# Patient Record
Sex: Male | Born: 1940 | Race: White | Hispanic: No | Smoking: Former smoker
Health system: Southern US, Community
[De-identification: ages and names within clinical notes are randomized; demographics above are authoritative.]

## PROBLEM LIST (undated history)

## (undated) DIAGNOSIS — J189 Pneumonia, unspecified organism: Secondary | ICD-10-CM

## (undated) DIAGNOSIS — I10 Essential (primary) hypertension: Secondary | ICD-10-CM

## (undated) DIAGNOSIS — M62838 Other muscle spasm: Secondary | ICD-10-CM

## (undated) DIAGNOSIS — I2510 Atherosclerotic heart disease of native coronary artery without angina pectoris: Secondary | ICD-10-CM

## (undated) DIAGNOSIS — I639 Cerebral infarction, unspecified: Secondary | ICD-10-CM

## (undated) DIAGNOSIS — E291 Testicular hypofunction: Secondary | ICD-10-CM

## (undated) DIAGNOSIS — D649 Anemia, unspecified: Secondary | ICD-10-CM

## (undated) DIAGNOSIS — E119 Type 2 diabetes mellitus without complications: Secondary | ICD-10-CM

## (undated) DIAGNOSIS — I480 Paroxysmal atrial fibrillation: Secondary | ICD-10-CM

## (undated) DIAGNOSIS — I420 Dilated cardiomyopathy: Secondary | ICD-10-CM

## (undated) DIAGNOSIS — E785 Hyperlipidemia, unspecified: Secondary | ICD-10-CM

## (undated) DIAGNOSIS — G43909 Migraine, unspecified, not intractable, without status migrainosus: Secondary | ICD-10-CM

## (undated) DIAGNOSIS — I255 Ischemic cardiomyopathy: Secondary | ICD-10-CM

## (undated) DIAGNOSIS — M1990 Unspecified osteoarthritis, unspecified site: Secondary | ICD-10-CM

## (undated) DIAGNOSIS — M109 Gout, unspecified: Secondary | ICD-10-CM

## (undated) DIAGNOSIS — N200 Calculus of kidney: Secondary | ICD-10-CM

## (undated) HISTORY — DX: Ischemic cardiomyopathy: I25.5

## (undated) HISTORY — PX: APPENDECTOMY: SHX54

## (undated) HISTORY — DX: Dilated cardiomyopathy: I42.0

## (undated) HISTORY — DX: Anemia, unspecified: D64.9

## (undated) HISTORY — DX: Gout, unspecified: M10.9

## (undated) HISTORY — DX: Other muscle spasm: M62.838

## (undated) HISTORY — PX: TONSILLECTOMY: SUR1361

## (undated) HISTORY — DX: Paroxysmal atrial fibrillation: I48.0

## (undated) HISTORY — DX: Essential (primary) hypertension: I10

## (undated) HISTORY — DX: Cerebral infarction, unspecified: I63.9

## (undated) HISTORY — DX: Testicular hypofunction: E29.1

## (undated) HISTORY — DX: Hyperlipidemia, unspecified: E78.5

## (undated) HISTORY — DX: Atherosclerotic heart disease of native coronary artery without angina pectoris: I25.10

---

## 1959-08-02 DIAGNOSIS — N200 Calculus of kidney: Secondary | ICD-10-CM

## 1959-08-02 HISTORY — DX: Calculus of kidney: N20.0

## 1986-12-01 HISTORY — PX: CARPAL TUNNEL RELEASE: SHX101

## 1989-12-01 HISTORY — PX: SOFT TISSUE MASS EXCISION: SHX2419

## 2007-05-21 ENCOUNTER — Inpatient Hospital Stay (HOSPITAL_COMMUNITY)
Admission: RE | Admit: 2007-05-21 | Discharge: 2007-06-11 | Payer: Self-pay | Admitting: Physical Medicine & Rehabilitation

## 2007-05-21 ENCOUNTER — Ambulatory Visit: Payer: Self-pay | Admitting: Physical Medicine & Rehabilitation

## 2011-04-15 NOTE — Discharge Summary (Signed)
NAMEGRAY, DOERING                ACCOUNT NO.:  [id number]   MEDICAL RECORD NO.:  [id number]          PATIENT TYPE:  IPS   LOCATION:  4029                         FACILITY:  [facility]   PHYSICIAN:  Ranelle Oyster, M.D.DATE OF BIRTH:  [birth year]   DATE OF ADMISSION:  05/21/2007  DATE OF DISCHARGE:  06/11/2007                               DISCHARGE SUMMARY   DISCHARGE DIAGNOSES:  1. 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  2. Hypertension.  3. Non-insulin dependent diabetes mellitus.  4. Hyperlipidemia.  5. Subcutaneous Lovenox for deep vein thrombosis prophylaxis.   HISTORY OF PRESENT ILLNESS:  This is a 70 year old white male admitted  to [facility] May 18, 2007, with right-sided weakness.  Cranial  CT scan negative and MRI showed acute nonhemorrhagic posterior left  lenticular nucleus and posterior left corona radiata infarction.  Carotid Dopplers negative.  Chest x-ray negative.  Placed on aspirin and  Plavix therapy.  Subcutaneous Lovenox for deep vein thrombosis  prophylaxis.  Blood pressure monitored with lisinopril added.  Blood  sugars still with some variables on Actos, glyburide and Glucophage.   PAST MEDICAL HISTORY:  See discharge diagnoses.  Remote smoker.  Occasional alcohol.   ALLERGIES:  PENICILLIN.   SOCIAL HISTORY:  Divorced, lives alone.  Daughter from [place] to assist  as needed.  One level home with four steps to entry.  There is a ramp to  be installed.   MEDICATIONS PRIOR TO ADMISSION:  1. Glipizide 20 mg twice daily.  2. Lisinopril 10 mg daily.  3. Glucophage 1000 mg twice daily.   REHABILITATION HOSPITAL COURSE:  The patient was admitted to inpatient  rehab services with therapies initiated on a 3-hour daily basis  consisting of physical therapy, occupational therapy, speech therapy and  rehabilitation nursing.  The following issues were addressed during the  patient's rehabilitation stay.  Pertaining to Mr. Brackeen's left  basal  ganglia infarction with right-sided weakness, he remained on aspirin and  Plavix therapy.  Functionally, he was supervision to minimal assist for  mobility basic transfers.  He was fitted with an AFO brace.  Modified  independent for upper body bathing and dressing, minimal assist lower  body.  He remained on subcutaneous Lovenox for deep vein thrombosis  prophylaxis throughout his rehab course.  Noted history of non-insulin-  dependent diabetes mellitus.  Latest blood sugars 83, 101 and 90.  His  Glucophage had recently been increased to 1250 mg twice daily.  Blood  pressures monitored on lisinopril with no orthostatic changes.  Zocor  had been added for findings of hyperlipidemia during his workup for  stroke.  Baclofen was added at 10 mg twice daily for some increased  spasticity to the right side.   LABORATORY DATA:  Latest labs showed a hemoglobin A1c of 7.5.  Sodium  137, potassium 4.8, BUN 21, creatinine 0.8.  Hemoglobin 14.4, hematocrit  41.9.   DISCHARGE MEDICATIONS:  At the time of dictation included  1. Aspirin 81 mg daily.  2. Plavix 75 mg daily.  3. Glipizide 20 mg twice daily.  4. Lisinopril 40 mg daily.  5. Zocor 80 mg at bedtime.  6. Actos 45 mg daily.  7. Glucophage 1250 mg twice daily.  8. Baclofen 10 mg twice daily.  9. Tylenol as needed.   ACTIVITY:  As tolerated.   DIET:  Diabetic diet.   SPECIAL INSTRUCTIONS:  The patient would follow up Dr. Faith Rogue at  the outpatient rehab service office as advised.      Mariam Dollar, P.A.      Ranelle Oyster, M.D.  Electronically Signed    DA/MEDQ  D:  06/09/2007  T:  06/09/2007  Job:  [id number]
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
## 2011-09-17 LAB — COMPREHENSIVE METABOLIC PANEL
ALT: 22
AST: 24
Alkaline Phosphatase: 44
CO2: 27
Calcium: 9.6
GFR calc Af Amer: >60
GFR calc non Af Amer: >60
Potassium: 4.8
Sodium: 137

## 2011-09-17 LAB — DIFFERENTIAL
Basophils Relative: 2 — ABNORMAL HIGH
Eosinophils Absolute: 0.1
Eosinophils Relative: 1
Lymphs Abs: 1.8
Monocytes Relative: 7

## 2011-09-17 LAB — CBC
Hemoglobin: 14.4
MCHC: 34.5
RBC: 4.37
WBC: 6.4

## 2011-09-17 LAB — HEMOGLOBIN A1C: Hgb A1c MFr Bld: 7.5 — ABNORMAL HIGH

## 2015-06-27 ENCOUNTER — Encounter: Payer: Self-pay | Admitting: Cardiology

## 2015-06-27 ENCOUNTER — Ambulatory Visit (INDEPENDENT_AMBULATORY_CARE_PROVIDER_SITE_OTHER): Payer: Medicare Other | Admitting: Cardiology

## 2015-06-27 ENCOUNTER — Telehealth (HOSPITAL_COMMUNITY): Payer: Self-pay

## 2015-06-27 VITALS — BP 122/66 | HR 105 | Ht 68.0 in | Wt 196.8 lb

## 2015-06-27 DIAGNOSIS — I481 Persistent atrial fibrillation: Secondary | ICD-10-CM | POA: Diagnosis not present

## 2015-06-27 DIAGNOSIS — Z01812 Encounter for preprocedural laboratory examination: Secondary | ICD-10-CM | POA: Diagnosis not present

## 2015-06-27 DIAGNOSIS — E785 Hyperlipidemia, unspecified: Secondary | ICD-10-CM | POA: Diagnosis not present

## 2015-06-27 DIAGNOSIS — R0689 Other abnormalities of breathing: Secondary | ICD-10-CM | POA: Insufficient documentation

## 2015-06-27 DIAGNOSIS — I420 Dilated cardiomyopathy: Secondary | ICD-10-CM

## 2015-06-27 DIAGNOSIS — I429 Cardiomyopathy, unspecified: Secondary | ICD-10-CM | POA: Insufficient documentation

## 2015-06-27 DIAGNOSIS — I10 Essential (primary) hypertension: Secondary | ICD-10-CM | POA: Insufficient documentation

## 2015-06-27 DIAGNOSIS — E1121 Type 2 diabetes mellitus with diabetic nephropathy: Secondary | ICD-10-CM | POA: Diagnosis not present

## 2015-06-27 DIAGNOSIS — I4819 Other persistent atrial fibrillation: Secondary | ICD-10-CM | POA: Insufficient documentation

## 2015-06-27 DIAGNOSIS — E119 Type 2 diabetes mellitus without complications: Secondary | ICD-10-CM | POA: Insufficient documentation

## 2015-06-27 LAB — BASIC METABOLIC PANEL
BUN: 9 mg/dL (ref 6–23)
CHLORIDE: 101 meq/L (ref 96–112)
CO2: 26 meq/L (ref 19–32)
Calcium: 9.6 mg/dL (ref 8.4–10.5)
Creatinine, Ser: 0.84 mg/dL (ref 0.40–1.50)
GFR: 94.43 mL/min (ref >60.00)
GLUCOSE: 154 mg/dL — AB (ref 70–99)
Potassium: 4.4 mEq/L (ref 3.5–5.1)
Sodium: 135 mEq/L (ref 135–145)

## 2015-06-27 LAB — CBC WITH DIFFERENTIAL/PLATELET
BASOS PCT: 0.3 % (ref 0.0–3.0)
Basophils Absolute: 0 10*3/uL (ref 0.0–0.1)
EOS PCT: 0.6 % (ref 0.0–5.0)
Eosinophils Absolute: 0 10*3/uL (ref 0.0–0.7)
HEMATOCRIT: 44 % (ref 39.0–52.0)
HEMOGLOBIN: 14.7 g/dL (ref 13.0–17.0)
LYMPHS PCT: 22.4 % (ref 12.0–46.0)
Lymphs Abs: 1.9 10*3/uL (ref 0.7–4.0)
MCHC: 33.3 g/dL (ref 30.0–36.0)
MCV: 95.6 fl (ref 78.0–100.0)
MONOS PCT: 4.8 % (ref 3.0–12.0)
Monocytes Absolute: 0.4 10*3/uL (ref 0.1–1.0)
Neutro Abs: 6.1 10*3/uL (ref 1.4–7.7)
Neutrophils Relative %: 71.9 % (ref 43.0–77.0)
Platelets: 279 10*3/uL (ref 150.0–400.0)
RBC: 4.6 Mil/uL (ref 4.22–5.81)
RDW: 13.2 % (ref 11.5–15.5)
WBC: 8.5 10*3/uL (ref 4.0–10.5)

## 2015-06-27 LAB — APTT: aPTT: 38.7 s — ABNORMAL HIGH (ref 23.4–32.7)

## 2015-06-27 LAB — PROTIME-INR
INR: 1.5 ratio — ABNORMAL HIGH (ref 0.8–1.0)
Prothrombin Time: 16.6 s — ABNORMAL HIGH (ref 9.6–13.1)

## 2015-06-27 MED ORDER — CARVEDILOL 3.125 MG PO TABS: 3.1250 mg | ORAL_TABLET | Freq: Two times a day (BID) | ORAL | Status: DC

## 2015-06-27 NOTE — Progress Notes (Signed)
Cardiology Office Note   Date:  06/27/2015   ID:  Scott Levine [birth year], MRN [id number]  PCP:  Scott Rich, MD    Chief Complaint  Patient presents with  . New Evaluation    Atrial Fib      History of Present Illness: Scott Levine is a 74 y.o. male who presents for evaluation of new onset atrial fibrillation.  He has a history of HTN, Type II DM, dyslipidemia and prior CVA.  He was recently see by his PCP for routine PE and was noted to be in new afib by EKG.  2D echo was done showing moderately reduced LVF with EF 35-40% with mid anteroseptal and anteroapical HK, mildly dilated LA.  He was started on Xarelto and now presents for further evaluation.  He says that he has had occasional skipped beats for years but no afib.  He denies any SOB, DOE, LE edema or syncope.  He has chronic dizziness that he describes as problems with balance.  He denies any prior history of chest pain other than a sharp pain earlier this year.  He does not snore but occasionally wakes up gasping for breath due to his nose being stopped.  If he blows his nose it goes away.      Past Medical History  Diagnosis Date  . HTN (hypertension)   . DM (diabetes mellitus)   . Atrial fibrillation   . Stroke   . Anemia   . Hyperlipidemia   . Hypogonadism male   . Gout   . Leg muscle spasm   . Cardiomyopathy     EF 35-40% echo    Past Surgical History  Procedure Laterality Date  . Appendectomy    . Tonsillectomy    . Other surgical history      GROWTH ON CHEST TAKEN OFF     Current Outpatient Prescriptions  Medication Sig Dispense Refill  . aspirin 81 MG tablet Take 81 mg by mouth daily.    Marland Kitchen glimepiride (AMARYL) 4 MG tablet Take 4 mg by mouth daily with breakfast.    . linagliptin (TRADJENTA) 5 MG TABS tablet Take 5 mg by mouth daily.    Marland Kitchen lisinopril (PRINIVIL,ZESTRIL) 20 MG tablet Take 20 mg by mouth daily.    . metFORMIN (GLUCOPHAGE) 1000 MG tablet Take 1,000 mg by mouth.     . rivaroxaban (XARELTO) 20 MG TABS tablet Take 20 mg by mouth daily with supper. To prevent stroke    . simvastatin (ZOCOR) 20 MG tablet Take 20 mg by mouth daily.     No current facility-administered medications for this visit.    Allergies:   Penicillins    Social History:  The patient  reports that he quit smoking about 51 years ago. He does not have any smokeless tobacco history on file. He reports that he does not drink alcohol or use illicit drugs.   Family History:  The patient's family history includes Cancer in his mother and sister; Cancer - Other in his brother; Diabetes in his maternal grandfather, maternal grandmother, mother, and son; Heart attack in his father; Other in his brother, brother, daughter, and son.    ROS:  Please see the history of present illness.   Otherwise, review of systems are positive for none.   All other systems are reviewed and negative.    PHYSICAL EXAM: VS:  BP 100/58 mmHg  Pulse 105  Ht 5\' 8"  (1.727 m)  Wt 196 lb 12.8 oz (89.268 kg)  BMI 29.93 kg/m2 , BMI Body mass index is 29.93 kg/(m^2). GEN: Well nourished, well developed, in no acute distress HEENT: normal Neck: no JVD, carotid bruits, or masses Cardiac: irregularly irregular; no murmurs, rubs, or gallops,no edema  Respiratory:  clear to auscultation bilaterally, normal work of breathing GI: soft, nontender, nondistended, + BS MS: no deformity or atrophy Skin: warm and dry, no rash Neuro:  Strength and sensation are intact Psych: euthymic mood, full affect   EKG:  EKG is ordered today. The ekg ordered today demonstrates atrial fibrillation with RVR at 105 bpm with occasional aberration and nonspecific T wave abnormality   Recent Labs: No results found for requested labs within last 365 days.    Lipid Panel No results found for: CHOL, TRIG, HDL, CHOLHDL, VLDL, LDLCALC, LDLDIRECT    Wt Readings from Last 3 Encounters:  06/27/15 196 lb 12.8 oz (89.268 kg)         ASSESSMENT AND PLAN:  1.  New onset atrial fibrillation of unknown duration now on Xarelto for past 4 weeks.  This patients CHA2DS2-VASc Score and unadjusted Ischemic Stroke Rate (% per year) is equal to 9.8 % stroke rate/year from a score of 6.  Above score calculated as 1 point each if present [CHF, HTN, DM, ascular=MI/PAD/Aortic Plaque, Age if 65-74, or Male].  Above score calculated as 2 points each if present [Age > 75, or Stroke/TIA/TE]  His HR is not adequately controlled.  I will add Coreg 3.125mg  BID for rate control.  He has been on xarelto for 4 weeks and has not missed any doses.  Will set up for DCCV next week.  I will also get a home sleep study to rule out PAF given his afib and problems with SOB and gasping for air at night. 2.  HTN - controlled on ACE I 3.  Type II DM 4.  H/O CVA 5.  DCM ? Tachycardia mediated from afib.  Continue ACE I and add low dose coreg.  He appears euvolemic on exam.     Current medicines are reviewed at length with the patient today.  The patient does not have concerns regarding medicines.  The following changes have been made:  no change  Labs/ tests ordered today: See above Assessment and Plan No orders of the defined types were placed in this encounter.     Disposition:   FU with me in 4 weeks  Signed, Quintella Reichert, MD  06/27/2015 9:45 AM    [facility] [address] [place], [place], [place]  [address] Phone: [phone]; Fax: [phone]

## 2015-06-27 NOTE — Patient Instructions (Addendum)
Medication Instructions:  Your physician has recommended you make the following change in your medication:  1) STOP ASPIRIN 2) START COREG 3.125 mg TWICE DAILY  Labwork: TODAY: BMET. CBC, PT, PTT  Testing/Procedures: Your physician has recommended that you have a Cardioversion (DCCV). Electrical Cardioversion uses a jolt of electricity to your heart either through paddles or wired patches attached to your chest. This is a controlled, usually prescheduled, procedure. Defibrillation is done under light anesthesia in the hospital, and you usually go home the day of the procedure. This is done to get your heart back into a normal rhythm. You are not awake for the procedure. Please see the instruction sheet given to you today.  Your physician has requested that you have a lexiscan myoview. For further information please visit [email or web address] Please follow instruction sheet, as given.  Follow-Up: Your physician recommends that you schedule a follow-up appointment in: 4 weeks with Dr. Mayford Knife.  Any Other Special Instructions Will Be Listed Below (If Applicable).

## 2015-06-27 NOTE — Telephone Encounter (Signed)
Patient given detailed instructions per Myocardial Perfusion Study Information Sheet for test on 07-02-2015 at 0815. Patient Notified to arrive 15 minutes early, and that it is imperative to arrive on time for appointment to keep from having the test rescheduled. Patient verbalized understanding. Randa Evens, Jiyah Torpey A

## 2015-06-28 ENCOUNTER — Telehealth: Payer: Self-pay

## 2015-06-28 NOTE — Telephone Encounter (Signed)
Informed patient of results and verbal understanding expressed.  Patient DCCV scheduled 07/05/15 with Dr. Eden Emms. Instructions reviewed and patient will pick up letter on Monday when he comes for myoview. Patient agrees with treatment plan.

## 2015-06-30 ENCOUNTER — Other Ambulatory Visit: Payer: Self-pay | Admitting: Cardiology

## 2015-06-30 DIAGNOSIS — I4891 Unspecified atrial fibrillation: Secondary | ICD-10-CM

## 2015-07-02 ENCOUNTER — Ambulatory Visit (HOSPITAL_COMMUNITY): Payer: Medicare Other | Attending: Cardiovascular Disease

## 2015-07-02 DIAGNOSIS — Z8249 Family history of ischemic heart disease and other diseases of the circulatory system: Secondary | ICD-10-CM | POA: Insufficient documentation

## 2015-07-02 DIAGNOSIS — I420 Dilated cardiomyopathy: Secondary | ICD-10-CM | POA: Diagnosis present

## 2015-07-02 DIAGNOSIS — E119 Type 2 diabetes mellitus without complications: Secondary | ICD-10-CM | POA: Insufficient documentation

## 2015-07-02 DIAGNOSIS — I10 Essential (primary) hypertension: Secondary | ICD-10-CM | POA: Insufficient documentation

## 2015-07-02 DIAGNOSIS — Z8673 Personal history of transient ischemic attack (TIA), and cerebral infarction without residual deficits: Secondary | ICD-10-CM | POA: Diagnosis not present

## 2015-07-02 DIAGNOSIS — R42 Dizziness and giddiness: Secondary | ICD-10-CM | POA: Diagnosis not present

## 2015-07-02 DIAGNOSIS — E663 Overweight: Secondary | ICD-10-CM | POA: Insufficient documentation

## 2015-07-02 DIAGNOSIS — R079 Chest pain, unspecified: Secondary | ICD-10-CM | POA: Insufficient documentation

## 2015-07-02 DIAGNOSIS — Z87891 Personal history of nicotine dependence: Secondary | ICD-10-CM | POA: Diagnosis not present

## 2015-07-02 MED ORDER — REGADENOSON 0.4 MG/5ML IV SOLN
0.4000 mg | Freq: Once | INTRAVENOUS | Status: AC
  Administered 2015-07-02: 0.4 mg via INTRAVENOUS

## 2015-07-02 MED ORDER — TECHNETIUM TC 99M SESTAMIBI GENERIC - CARDIOLITE
29.7000 | Freq: Once | INTRAVENOUS | Status: AC | PRN
  Administered 2015-07-02: 30 via INTRAVENOUS

## 2015-07-02 MED ORDER — TECHNETIUM TC 99M SESTAMIBI GENERIC - CARDIOLITE
10.8000 | Freq: Once | INTRAVENOUS | Status: AC | PRN
  Administered 2015-07-02: 11 via INTRAVENOUS

## 2015-07-03 LAB — MYOCARDIAL PERFUSION IMAGING
CHL CUP NUCLEAR SRS: 4
CSEPPBP: 132.77 mmHg
CSEPPHR: 106 {beats}/min
RATE: 0.33
Rest HR: 76 {beats}/min
SDS: 3
SSS: 7
TID: 1

## 2015-07-05 ENCOUNTER — Telehealth: Payer: Self-pay

## 2015-07-05 ENCOUNTER — Ambulatory Visit (HOSPITAL_COMMUNITY)
Admission: RE | Admit: 2015-07-05 | Discharge: 2015-07-05 | Disposition: A | Discharge status: Home / Self Care | Payer: Medicare Other | Source: Ambulatory Visit | Attending: Cardiovascular Disease | Admitting: Cardiovascular Disease

## 2015-07-05 ENCOUNTER — Encounter (HOSPITAL_COMMUNITY): Payer: Self-pay

## 2015-07-05 ENCOUNTER — Ambulatory Visit (HOSPITAL_COMMUNITY): Payer: Medicare Other | Admitting: Certified Registered Nurse Anesthetist

## 2015-07-05 ENCOUNTER — Encounter (HOSPITAL_COMMUNITY)
Admission: RE | Disposition: A | Discharge status: Home / Self Care | Payer: Self-pay | Source: Ambulatory Visit | Attending: Cardiovascular Disease

## 2015-07-05 DIAGNOSIS — R0689 Other abnormalities of breathing: Secondary | ICD-10-CM

## 2015-07-05 DIAGNOSIS — Z7982 Long term (current) use of aspirin: Secondary | ICD-10-CM | POA: Insufficient documentation

## 2015-07-05 DIAGNOSIS — I10 Essential (primary) hypertension: Secondary | ICD-10-CM | POA: Insufficient documentation

## 2015-07-05 DIAGNOSIS — E785 Hyperlipidemia, unspecified: Secondary | ICD-10-CM | POA: Insufficient documentation

## 2015-07-05 DIAGNOSIS — I69351 Hemiplegia and hemiparesis following cerebral infarction affecting right dominant side: Secondary | ICD-10-CM | POA: Diagnosis not present

## 2015-07-05 DIAGNOSIS — I429 Cardiomyopathy, unspecified: Secondary | ICD-10-CM | POA: Diagnosis not present

## 2015-07-05 DIAGNOSIS — Z7901 Long term (current) use of anticoagulants: Secondary | ICD-10-CM | POA: Insufficient documentation

## 2015-07-05 DIAGNOSIS — Z87891 Personal history of nicotine dependence: Secondary | ICD-10-CM | POA: Diagnosis not present

## 2015-07-05 DIAGNOSIS — I4891 Unspecified atrial fibrillation: Secondary | ICD-10-CM

## 2015-07-05 DIAGNOSIS — E119 Type 2 diabetes mellitus without complications: Secondary | ICD-10-CM | POA: Diagnosis not present

## 2015-07-05 DIAGNOSIS — Z79899 Other long term (current) drug therapy: Secondary | ICD-10-CM | POA: Diagnosis not present

## 2015-07-05 HISTORY — PX: CARDIOVERSION: SHX1299

## 2015-07-05 LAB — GLUCOSE, CAPILLARY: Glucose-Capillary: 155 mg/dL — ABNORMAL HIGH (ref 65–99)

## 2015-07-05 SURGERY — CARDIOVERSION
Anesthesia: Monitor Anesthesia Care

## 2015-07-05 MED ORDER — LIDOCAINE HCL (CARDIAC) 20 MG/ML IV SOLN
INTRAVENOUS | Status: DC | PRN
  Administered 2015-07-05: 30 mg via INTRAVENOUS

## 2015-07-05 MED ORDER — SODIUM CHLORIDE 0.9 % IJ SOLN: 3.0000 mL | Freq: Two times a day (BID) | INTRAMUSCULAR | Status: DC

## 2015-07-05 MED ORDER — SODIUM CHLORIDE 0.9 % IJ SOLN: 3.0000 mL | INTRAMUSCULAR | Status: DC | PRN

## 2015-07-05 MED ORDER — SODIUM CHLORIDE 0.9 % IV SOLN
250.0000 mL | INTRAVENOUS | Status: DC
  Administered 2015-07-05: 10:00:00 via INTRAVENOUS
  Administered 2015-07-05: 250 mL via INTRAVENOUS

## 2015-07-05 MED ORDER — PROPOFOL 10 MG/ML IV BOLUS
INTRAVENOUS | Status: DC | PRN
  Administered 2015-07-05: 50 mg via INTRAVENOUS

## 2015-07-05 NOTE — Anesthesia Postprocedure Evaluation (Signed)
  Anesthesia Post-op Note  Patient: Scott Levine  Procedure(s) Performed: Procedure(s): CARDIOVERSION (N/A)  Patient Location: PACU  Anesthesia Type:MAC  Level of Consciousness: awake  Airway and Oxygen Therapy: Patient Spontanous Breathing  Post-op Pain: none  Post-op Assessment: Post-op Vital signs reviewed              Post-op Vital Signs: Reviewed and stable  Last Vitals:  Filed Vitals:   07/05/15 1022  BP: 127/81  Pulse: 84  Temp:   Resp: 23    Complications: No apparent anesthesia complications

## 2015-07-05 NOTE — Transfer of Care (Signed)
Immediate Anesthesia Transfer of Care Note  Patient: Scott Levine  Procedure(s) Performed: Procedure(s): CARDIOVERSION (N/A)  Patient Location: Endoscopy Unit  Anesthesia Type:MAC  Level of Consciousness: sedated  Airway & Oxygen Therapy: Patient Spontanous Breathing and Patient connected to nasal cannula oxygen  Post-op Assessment: Report given to RN and Post -op Vital signs reviewed and stable  Post vital signs: Reviewed and stable  Last Vitals:  Filed Vitals:   07/05/15 1022  BP: 127/81  Pulse: 84  Temp:   Resp: 23    Complications: No apparent anesthesia complications

## 2015-07-05 NOTE — Anesthesia Preprocedure Evaluation (Addendum)
Anesthesia Evaluation  Patient identified by MRN, date of birth, ID band  Reviewed: Allergy & Precautions, NPO status , Patient's Chart, lab work & pertinent test results  Airway Mallampati: II   Neck ROM: Full    Dental  (+) Missing, Dental Advisory Given   Pulmonary former smoker,    Pulmonary exam normal       Cardiovascular hypertension, Pt. on medications + dysrhythmias Atrial Fibrillation Rhythm:Irregular Rate:Normal  STRESS 07/02/2015 Fixed inferior defect   Neuro/Psych CVA (right sided weakness- uses walker), Residual Symptoms    GI/Hepatic   Endo/Other  diabetes, Type 2  Renal/GU      Musculoskeletal   Abdominal Normal abdominal exam  (+)   Peds  Hematology   Anesthesia Other Findings   Reproductive/Obstetrics                           Anesthesia Physical Anesthesia Plan  ASA: III  Anesthesia Plan: MAC   Post-op Pain Management:    Induction: Intravenous  Airway Management Planned: Nasal Cannula  Additional Equipment:   Intra-op Plan:   Post-operative Plan:   Informed Consent: I have reviewed the patients History and Physical, chart, labs and discussed the procedure including the risks, benefits and alternatives for the proposed anesthesia with the patient or authorized representative who has indicated his/her understanding and acceptance.     Plan Discussed with:   Anesthesia Plan Comments:         Anesthesia Quick Evaluation

## 2015-07-05 NOTE — CV Procedure (Signed)
DCC:  50 mg propofol and 30 mg lidocain by anesthesia  Afib rate 90 with NSVT and PVC;s prior to [facility] 120 J biphasic shock delivered with conversion to NSR On Rx Xarelto with no missed doses No immediate neurologic sequelae  [name]

## 2015-07-05 NOTE — Interval H&P Note (Signed)
History and Physical Interval Note:  07/05/2015 8:07 AM  Scott Levine  has presented today for surgery, with the diagnosis of AFIB  The various methods of treatment have been discussed with the patient and family. After consideration of risks, benefits and other options for treatment, the patient has consented to  Procedure(s): CARDIOVERSION (N/A) as a surgical intervention .  The patient's history has been reviewed, patient examined, no change in status, stable for surgery.  I have reviewed the patient's chart and labs.  Questions were answered to the patient's satisfaction.     [name]

## 2015-07-05 NOTE — Telephone Encounter (Signed)
Notes Recorded by Quintella Reichert, MD on 07/03/2015 at 2:11 PM Patient had echo at outside facility and I would like this repeated Notes Recorded by Quintella Reichert, MD on 07/03/2015 at 2:09 PM Inferolateral fixed defect c/w scar vs. Attenuation - please get an echo to assess for wall motion and EF   Repeat ECHO ordered for scheduling.

## 2015-07-05 NOTE — Discharge Instructions (Signed)
Electrical Cardioversion, Care After °Refer to this sheet in the next few weeks. These instructions provide you with information on caring for yourself after your procedure. Your health care provider may also give you more specific instructions. Your treatment has been planned according to current medical practices, but problems sometimes occur. Call your health care provider if you have any problems or questions after your procedure. °WHAT TO EXPECT AFTER THE PROCEDURE °After your procedure, it is typical to have the following sensations: °· Some redness on the skin where the shocks were delivered. If this is tender, a sunburn lotion or hydrocortisone cream may help. °· Possible return of an abnormal heart rhythm within hours or days after the procedure. °HOME CARE INSTRUCTIONS °· Take medicines only as directed by your health care provider. Be sure you understand how and when to take your medicine. °· Learn how to feel your pulse and check it often. °· Limit your activity for 48 hours after the procedure or as directed by your health care provider. °· Avoid or minimize caffeine and other stimulants as directed by your health care provider. °SEEK MEDICAL CARE IF: °· You feel like your heart is beating too fast or your pulse is not regular. °· You have any questions about your medicines. °· You have bleeding that will not stop. °SEEK IMMEDIATE MEDICAL CARE IF: °· You are dizzy or feel faint. °· It is hard to breathe or you feel short of breath. °· There is a change in discomfort in your chest. °· Your speech is slurred or you have trouble moving an arm or leg on one side of your body. °· You get a serious muscle cramp that does not go away. °· Your fingers or toes turn cold or blue. °Document Released: 09/07/2013 Document Revised: 04/03/2014 Document Reviewed: 09/07/2013 °[employer]® Patient Information ©2015 [employer]. This information is not intended to replace advice given to you by your health care provider.  Make sure you discuss any questions you have with your health care provider. ° ° °Conscious Sedation, Adult, Care After °Refer to this sheet in the next few weeks. These instructions provide you with information on caring for yourself after your procedure. Your health care provider may also give you more specific instructions. Your treatment has been planned according to current medical practices, but problems sometimes occur. Call your health care provider if you have any problems or questions after your procedure. °WHAT TO EXPECT AFTER THE PROCEDURE  °After your procedure: °· You may feel sleepy, clumsy, and have poor balance for several hours. °· Vomiting may occur if you eat too soon after the procedure. °HOME CARE INSTRUCTIONS °· Do not participate in any activities where you could become injured for at least 24 hours. Do not: °¨ Drive. °¨ Swim. °¨ Ride a bicycle. °¨ Operate heavy machinery. °¨ Cook. °¨ Use power tools. °¨ Climb ladders. °¨ Work from a high place. °· Do not make important decisions or sign legal documents until you are improved. °· If you vomit, drink water, juice, or soup when you can drink without vomiting. Make sure you have little or no nausea before eating solid foods. °· Only take over-the-counter or prescription medicines for pain, discomfort, or fever as directed by your health care provider. °· Make sure you and your family fully understand everything about the medicines given to you, including what side effects may occur. °· You should not drink alcohol, take sleeping pills, or take medicines that cause drowsiness for at least 24   hours. °· If you smoke, do not smoke without supervision. °· If you are feeling better, you may resume normal activities 24 hours after you were sedated. °· Keep all appointments with your health care provider. °SEEK MEDICAL CARE IF: °· Your skin is pale or bluish in color. °· You continue to feel nauseous or vomit. °· Your pain is getting worse and is not  helped by medicine. °· You have bleeding or swelling. °· You are still sleepy or feeling clumsy after 24 hours. °SEEK IMMEDIATE MEDICAL CARE IF: °· You develop a rash. °· You have difficulty breathing. °· You develop any type of allergic problem. °· You have a fever. °MAKE SURE YOU: °· Understand these instructions. °· Will watch your condition. °· Will get help right away if you are not doing well or get worse. °Document Released: 09/07/2013 Document Reviewed: 09/07/2013 °[employer]® Patient Information ©2015 [employer]. This information is not intended to replace advice given to you by your health care provider. Make sure you discuss any questions you have with your health care provider. ° °

## 2015-07-05 NOTE — H&P (View-Only) (Signed)
                Cardiology Office Note   Date:  06/27/2015   ID:  Scott Levine, DOB 11/30/1941, MRN [id number]  PCP:  DOUGH,ROBERT, MD    Chief Complaint  Patient presents with  . New Evaluation    Atrial Fib      History of Present Illness: Scott Levine is a 74 y.o. male who presents for evaluation of new onset atrial fibrillation.  He has a history of HTN, Type II DM, dyslipidemia and prior CVA.  He was recently see by his PCP for routine PE and was noted to be in new afib by EKG.  2D echo was done showing moderately reduced LVF with EF 35-40% with mid anteroseptal and anteroapical HK, mildly dilated LA.  He was started on Xarelto and now presents for further evaluation.  He says that he has had occasional skipped beats for years but no afib.  He denies any SOB, DOE, LE edema or syncope.  He has chronic dizziness that he describes as problems with balance.  He denies any prior history of chest pain other than a sharp pain earlier this year.  He does not snore but occasionally wakes up gasping for breath due to his nose being stopped.  If he blows his nose it goes away.      Past Medical History  Diagnosis Date  . HTN (hypertension)   . DM (diabetes mellitus)   . Atrial fibrillation   . Stroke   . Anemia   . Hyperlipidemia   . Hypogonadism male   . Gout   . Leg muscle spasm   . Cardiomyopathy     EF 35-40% echo    Past Surgical History  Procedure Laterality Date  . Appendectomy    . Tonsillectomy    . Other surgical history      GROWTH ON CHEST TAKEN OFF     Current Outpatient Prescriptions  Medication Sig Dispense Refill  . aspirin 81 MG tablet Take 81 mg by mouth daily.    . glimepiride (AMARYL) 4 MG tablet Take 4 mg by mouth daily with breakfast.    . linagliptin (TRADJENTA) 5 MG TABS tablet Take 5 mg by mouth daily.    . lisinopril (PRINIVIL,ZESTRIL) 20 MG tablet Take 20 mg by mouth daily.    . metFORMIN (GLUCOPHAGE) 1000 MG tablet Take 1,000 mg by mouth.     . rivaroxaban (XARELTO) 20 MG TABS tablet Take 20 mg by mouth daily with supper. To prevent stroke    . simvastatin (ZOCOR) 20 MG tablet Take 20 mg by mouth daily.     No current facility-administered medications for this visit.    Allergies:   Penicillins    Social History:  The patient  reports that he quit smoking about 51 years ago. He does not have any smokeless tobacco history on file. He reports that he does not drink alcohol or use illicit drugs.   Family History:  The patient's family history includes Cancer in his mother and sister; Cancer - Other in his brother; Diabetes in his maternal grandfather, maternal grandmother, mother, and son; Heart attack in his father; Other in his brother, brother, daughter, and son.    ROS:  Please see the history of present illness.   Otherwise, review of systems are positive for none.   All other systems are reviewed and negative.    PHYSICAL EXAM: VS:  BP 100/58 mmHg  Pulse 105    Ht 5\' 8"  (1.727 m)  Wt 196 lb 12.8 oz (89.268 kg)  BMI 29.93 kg/m2 , BMI Body mass index is 29.93 kg/(m^2). GEN: Well nourished, well developed, in no acute distress HEENT: normal Neck: no JVD, carotid bruits, or masses Cardiac: irregularly irregular; no murmurs, rubs, or gallops,no edema  Respiratory:  clear to auscultation bilaterally, normal work of breathing GI: soft, nontender, nondistended, + BS MS: no deformity or atrophy Skin: warm and dry, no rash Neuro:  Strength and sensation are intact Psych: euthymic mood, full affect   EKG:  EKG is ordered today. The ekg ordered today demonstrates atrial fibrillation with RVR at 105 bpm with occasional aberration and nonspecific T wave abnormality   Recent Labs: No results found for requested labs within last 365 days.    Lipid Panel No results found for: CHOL, TRIG, HDL, CHOLHDL, VLDL, LDLCALC, LDLDIRECT    Wt Readings from Last 3 Encounters:  06/27/15 196 lb 12.8 oz (89.268 kg)         ASSESSMENT AND PLAN:  1.  New onset atrial fibrillation of unknown duration now on Xarelto for past 4 weeks.  This patients CHA2DS2-VASc Score and unadjusted Ischemic Stroke Rate (% per year) is equal to 9.8 % stroke rate/year from a score of 6.  Above score calculated as 1 point each if present [CHF, HTN, DM, ascular=MI/PAD/Aortic Plaque, Age if 65-74, or Male].  Above score calculated as 2 points each if present [Age > 75, or Stroke/TIA/TE]  His HR is not adequately controlled.  I will add Coreg 3.125mg  BID for rate control.  He has been on xarelto for 4 weeks and has not missed any doses.  Will set up for DCCV next week.  I will also get a home sleep study to rule out PAF given his afib and problems with SOB and gasping for air at night. 2.  HTN - controlled on ACE I 3.  Type II DM 4.  H/O CVA 5.  DCM ? Tachycardia mediated from afib.  Continue ACE I and add low dose coreg.  He appears euvolemic on exam.     Current medicines are reviewed at length with the patient today.  The patient does not have concerns regarding medicines.  The following changes have been made:  no change  Labs/ tests ordered today: See above Assessment and Plan No orders of the defined types were placed in this encounter.     Disposition:   FU with me in 4 weeks  Signed, Quintella Reichert, MD  06/27/2015 9:45 AM    [facility] [address] [place], [place], [place]  [address] Phone: [phone]; Fax: [phone]

## 2015-07-06 ENCOUNTER — Encounter (HOSPITAL_COMMUNITY): Payer: Self-pay | Admitting: Cardiovascular Disease

## 2015-07-12 ENCOUNTER — Telehealth: Payer: Self-pay

## 2015-07-12 DIAGNOSIS — I493 Ventricular premature depolarization: Secondary | ICD-10-CM

## 2015-07-12 DIAGNOSIS — I4819 Other persistent atrial fibrillation: Secondary | ICD-10-CM

## 2015-07-12 NOTE — Telephone Encounter (Signed)
Per Dr. Mayford Knife, patient to wear event monitor to assess for ventricular arrhythmias due to some PVCs and NSVT post DCCV.  Monitor ordered for scheduling. Patient agrees with treatment plan.

## 2015-07-18 ENCOUNTER — Ambulatory Visit (INDEPENDENT_AMBULATORY_CARE_PROVIDER_SITE_OTHER): Payer: Medicare Other

## 2015-07-18 ENCOUNTER — Other Ambulatory Visit: Payer: Self-pay

## 2015-07-18 ENCOUNTER — Ambulatory Visit (HOSPITAL_COMMUNITY): Payer: Medicare Other | Attending: Cardiovascular Disease

## 2015-07-18 DIAGNOSIS — Z87891 Personal history of nicotine dependence: Secondary | ICD-10-CM | POA: Diagnosis not present

## 2015-07-18 DIAGNOSIS — I10 Essential (primary) hypertension: Secondary | ICD-10-CM | POA: Diagnosis not present

## 2015-07-18 DIAGNOSIS — I481 Persistent atrial fibrillation: Secondary | ICD-10-CM

## 2015-07-18 DIAGNOSIS — E785 Hyperlipidemia, unspecified: Secondary | ICD-10-CM | POA: Diagnosis not present

## 2015-07-18 DIAGNOSIS — I351 Nonrheumatic aortic (valve) insufficiency: Secondary | ICD-10-CM | POA: Diagnosis not present

## 2015-07-18 DIAGNOSIS — R0689 Other abnormalities of breathing: Secondary | ICD-10-CM | POA: Diagnosis not present

## 2015-07-18 DIAGNOSIS — I340 Nonrheumatic mitral (valve) insufficiency: Secondary | ICD-10-CM | POA: Insufficient documentation

## 2015-07-18 DIAGNOSIS — I493 Ventricular premature depolarization: Secondary | ICD-10-CM | POA: Diagnosis not present

## 2015-07-18 DIAGNOSIS — I517 Cardiomegaly: Secondary | ICD-10-CM | POA: Insufficient documentation

## 2015-07-18 DIAGNOSIS — I429 Cardiomyopathy, unspecified: Secondary | ICD-10-CM

## 2015-07-18 DIAGNOSIS — E119 Type 2 diabetes mellitus without complications: Secondary | ICD-10-CM | POA: Insufficient documentation

## 2015-07-19 ENCOUNTER — Telehealth: Payer: Self-pay | Admitting: *Deleted

## 2015-07-19 DIAGNOSIS — I429 Cardiomyopathy, unspecified: Secondary | ICD-10-CM

## 2015-07-19 DIAGNOSIS — I4819 Other persistent atrial fibrillation: Secondary | ICD-10-CM

## 2015-07-19 NOTE — Telephone Encounter (Signed)
Pt notified of echo results. Pt agreeable to plan of care to have limited echo to reassess LVF to be done in 2 months. Advised I will have scheduling call him with a date and time of ech. Pt said ok and thank you.

## 2015-07-23 ENCOUNTER — Other Ambulatory Visit: Payer: Self-pay | Admitting: Cardiology

## 2015-07-23 DIAGNOSIS — I4819 Other persistent atrial fibrillation: Secondary | ICD-10-CM

## 2015-07-23 DIAGNOSIS — I429 Cardiomyopathy, unspecified: Secondary | ICD-10-CM

## 2015-08-07 NOTE — Progress Notes (Signed)
Cardiology Office Note   Date:  08/08/2015   ID:  Lowel, Beston 1941/07/04, MRN [id number]  PCP:  Dina Rich, MD    Chief Complaint  Patient presents with  . PAF      History of Present Illness: Scott Levine is a 74 y.o. 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  He denies any CP, SOB, DOE, LE edema or syncope. He has chronic dizziness that he describes as problems with balance. He is currently is wearing a heart monitor to assess for PVC's and NSVT that were seen post cardioversion.    Past Medical History  Diagnosis Date  . HTN (hypertension)   . DM (diabetes mellitus)   . Atrial fibrillation   . Stroke   . Anemia   . Hyperlipidemia   . Hypogonadism male   . Gout   . Leg muscle spasm   . Cardiomyopathy     EF 35-40% echo    Past Surgical History  Procedure Laterality Date  . Appendectomy    . Tonsillectomy    . Other surgical history      GROWTH ON CHEST TAKEN OFF  . Cardioversion N/A 07/05/2015    Procedure: CARDIOVERSION;  Surgeon: Wendall Stade, MD;  [facility] END[facility];  Service: Cardiovascular;  Laterality: N/A;     Current Outpatient Prescriptions  Medication Sig Dispense Refill  . carvedilol (COREG) 3.125 MG tablet Take 1 tablet (3.125 mg total) by mouth 2 (two) times daily. 60 tablet 6  . glimepiride (AMARYL) 4 MG tablet Take 4 mg by mouth daily with breakfast.    . linagliptin (TRADJENTA) 5 MG TABS tablet Take 5 mg by mouth daily.    Marland Kitchen lisinopril (PRINIVIL,ZESTRIL) 20 MG tablet Take 20 mg by mouth daily.    . metFORMIN (GLUCOPHAGE) 1000 MG tablet Take 1,000 mg by mouth 2 (two) times daily with a meal.     . rivaroxaban (XARELTO) 20 MG TABS tablet Take 20 mg by mouth daily with supper. To prevent stroke    . simvastatin (ZOCOR) 20 MG tablet Take 20 mg by mouth daily.     No current facility-administered medications for this visit.    Allergies:    Penicillins    Social History:  The patient  reports that he quit smoking about 51 years ago. He does not have any smokeless tobacco history on file. He reports that he does not drink alcohol or use illicit drugs.   Family History:  The patient's family history includes Cancer in his mother and sister; Cancer - Other in his brother; Diabetes in his maternal grandfather, maternal grandmother, mother, and son; Heart attack in his father; Other in his brother, brother, daughter, and son.    ROS:  Please see the history of present illness.   Otherwise, review of systems are positive for none.   All other systems are reviewed and negative.    PHYSICAL EXAM: VS:  BP 110/60 mmHg  Pulse 63  Ht 5\' 8"  (1.727 m)  Wt 199 lb 9.6 oz (90.538 kg)  BMI 30.36 kg/m2  SpO2 95% , BMI Body mass index is 30.36 kg/(m^2). GEN: Well nourished, well developed, in no acute distress HEENT: normal Neck: no JVD, carotid bruits, or masses Cardiac: RRR; no murmurs, rubs, or gallops,no edema  Respiratory:  clear to auscultation  bilaterally, normal work of breathing GI: soft, nontender, nondistended, + BS MS: no deformity or atrophy Skin: warm and dry, no rash Neuro:  Strength and sensation are intact Psych: euthymic mood, full affect   EKG:  EKG was not ordered today.    Recent Labs: 06/27/2015: BUN 9; Creatinine, Ser 0.84; Hemoglobin 14.7; Platelets 279.0; Potassium 4.4; Sodium 135    Lipid Panel No results found for: CHOL, TRIG, HDL, CHOLHDL, VLDL, LDLCALC, LDLDIRECT    Wt Readings from Last 3 Encounters:  08/08/15 199 lb 9.6 oz (90.538 kg)  07/05/15 192 lb (87.091 kg)  07/02/15 196 lb (88.905 kg)     ASSESSMENT AND PLAN:  1. New onset atrial fibrillation on Xarelto s/p DCCV 07/05/2015. His CHA2DS2-VASc Score and unadjusted Ischemic Stroke Rate (% per year) is equal to 9.8 % stroke rate/year from a score of 6. Above score calculated as 1 point each if present [CHF, HTN, DM, Vascular=MI/PAD/Aortic  Plaque, Age if 65-74, or Male]. Above score calculated as 2 points each if present [Age > 75, or Stroke/TIA/TE].  Continue Coreg/Xarelto 2. HTN - controlled on ACE I/BB 3. Type II DM 4. H/O CVA 5. DCM ? Tachycardia mediated from afib. Continue ACE I and add low dose coreg. He appears euvolemic on exam. Repeat echo scheduled for 1 month.   Current medicines are reviewed at length with the patient today. The patient does not have concerns regarding medicines.  The following changes have been made: no change  Labs/ tests ordered today: See above Assessment and Plan No orders of the defined types were placed in this encounter.    Current medicines are reviewed at length with the patient today.  The patient does not have concerns regarding medicines.  The following changes have been made:  no change  Labs/ tests ordered today: See above Assessment and Plan No orders of the defined types were placed in this encounter.     Disposition:   FU with me in 6 months  Signed, Quintella Reichert, MD  08/08/2015 10:15 AM    [facility] [address] [place], [place], [place]  [address] Phone: [phone]; Fax: [phone]

## 2015-08-08 ENCOUNTER — Ambulatory Visit (INDEPENDENT_AMBULATORY_CARE_PROVIDER_SITE_OTHER): Payer: Medicare Other | Admitting: Cardiology

## 2015-08-08 ENCOUNTER — Encounter: Payer: Self-pay | Admitting: Cardiology

## 2015-08-08 VITALS — BP 110/60 | HR 63 | Ht 68.0 in | Wt 199.6 lb

## 2015-08-08 DIAGNOSIS — I10 Essential (primary) hypertension: Secondary | ICD-10-CM

## 2015-08-08 DIAGNOSIS — I480 Paroxysmal atrial fibrillation: Secondary | ICD-10-CM | POA: Diagnosis not present

## 2015-08-08 DIAGNOSIS — I429 Cardiomyopathy, unspecified: Secondary | ICD-10-CM | POA: Diagnosis not present

## 2015-08-08 NOTE — Patient Instructions (Signed)
Medication Instructions:  Your physician recommends that you continue on your current medications as directed. Please refer to the Current Medication list given to you today.   Labwork: None  Testing/Procedures: Your physician has requested that you have an echocardiogram in 4 weeks. Echocardiography is a painless test that uses sound waves to create images of your heart. It provides your doctor with information about the size and shape of your heart and how well your heart's chambers and valves are working. This procedure takes approximately one hour. There are no restrictions for this procedure.   Follow-Up: Your physician wants you to follow-up in: 6 months with Dr. Mayford Knife. You will receive a reminder letter in the mail two months in advance. If you don't receive a letter, please call our office to schedule the follow-up appointment.   Any Other Special Instructions Will Be Listed Below (If Applicable).

## 2015-08-22 ENCOUNTER — Telehealth: Payer: Self-pay | Admitting: Cardiology

## 2015-08-22 NOTE — Telephone Encounter (Signed)
New Message   The compan [employer]   Wants to know if the pt has a D AND E on her record

## 2015-08-23 NOTE — Telephone Encounter (Signed)
Called [employer] to see exactly what they were needing.  They were asking if we needed a DME listed for this patient.  I let her know that once we receive results we set them up with a DME with Dr. Norris Cross orders. She documented that and stated that they would schedule the patient for their study.

## 2015-09-05 ENCOUNTER — Ambulatory Visit (HOSPITAL_COMMUNITY): Payer: Medicare Other | Attending: Cardiology

## 2015-09-05 ENCOUNTER — Other Ambulatory Visit: Payer: Self-pay

## 2015-09-05 DIAGNOSIS — I429 Cardiomyopathy, unspecified: Secondary | ICD-10-CM

## 2015-09-05 DIAGNOSIS — I10 Essential (primary) hypertension: Secondary | ICD-10-CM | POA: Diagnosis not present

## 2015-09-05 DIAGNOSIS — I77810 Thoracic aortic ectasia: Secondary | ICD-10-CM | POA: Insufficient documentation

## 2015-09-05 DIAGNOSIS — E119 Type 2 diabetes mellitus without complications: Secondary | ICD-10-CM | POA: Diagnosis not present

## 2015-09-05 DIAGNOSIS — I340 Nonrheumatic mitral (valve) insufficiency: Secondary | ICD-10-CM | POA: Insufficient documentation

## 2015-09-05 DIAGNOSIS — E785 Hyperlipidemia, unspecified: Secondary | ICD-10-CM | POA: Diagnosis not present

## 2015-09-05 DIAGNOSIS — I351 Nonrheumatic aortic (valve) insufficiency: Secondary | ICD-10-CM | POA: Insufficient documentation

## 2015-09-05 DIAGNOSIS — I517 Cardiomegaly: Secondary | ICD-10-CM | POA: Insufficient documentation

## 2015-09-11 ENCOUNTER — Telehealth: Payer: Self-pay

## 2015-09-11 DIAGNOSIS — I429 Cardiomyopathy, unspecified: Secondary | ICD-10-CM

## 2015-09-11 DIAGNOSIS — R0689 Other abnormalities of breathing: Secondary | ICD-10-CM

## 2015-09-11 DIAGNOSIS — I480 Paroxysmal atrial fibrillation: Secondary | ICD-10-CM

## 2015-09-11 NOTE — Telephone Encounter (Signed)
Informed patient of results and verbal understanding expressed.  Coronary CT and BMET ordered for scheduling. Patient agrees with treatment plan. 

## 2015-09-11 NOTE — Telephone Encounter (Signed)
-----   Message from Quintella Reichert, MD sent at 09/10/2015 12:06 AM EDT ----- No myoview needed since one done recently - Myoview showed ? Scar inferiorly vs. Attenuation.  I favor attenuation given global LV dysfunction on echo with no isolated wall motion abnormalities. Please get a coronary CTA with morphology and calcium score to assess for CAD since he has no anginal symptoms

## 2015-09-13 ENCOUNTER — Telehealth: Payer: Self-pay | Admitting: Cardiology

## 2015-09-13 NOTE — Telephone Encounter (Signed)
Patient called to schedule his coronary CT. Informed patient that [facility] will call him tomorrow to schedule as the CT lab is closed now. Patient agrees with treatment plan and is grateful for callback.

## 2015-09-13 NOTE — Telephone Encounter (Signed)
New Message   Pt calling because he states that he was told he has a sound in his heart and   He wants to speak to the rn

## 2015-09-14 ENCOUNTER — Encounter: Payer: Self-pay | Admitting: Cardiology

## 2015-09-19 ENCOUNTER — Other Ambulatory Visit (INDEPENDENT_AMBULATORY_CARE_PROVIDER_SITE_OTHER): Payer: Medicare Other | Admitting: *Deleted

## 2015-09-19 DIAGNOSIS — I480 Paroxysmal atrial fibrillation: Secondary | ICD-10-CM

## 2015-09-19 DIAGNOSIS — E785 Hyperlipidemia, unspecified: Secondary | ICD-10-CM

## 2015-09-19 DIAGNOSIS — I429 Cardiomyopathy, unspecified: Secondary | ICD-10-CM

## 2015-09-19 DIAGNOSIS — I10 Essential (primary) hypertension: Secondary | ICD-10-CM

## 2015-09-19 LAB — BASIC METABOLIC PANEL
BUN: 9 mg/dL (ref 7–25)
CALCIUM: 9.5 mg/dL (ref 8.6–10.3)
CO2: 25 mmol/L (ref 20–31)
Chloride: 101 mmol/L (ref 98–110)
Creat: 0.9 mg/dL (ref 0.70–1.18)
Glucose, Bld: 177 mg/dL — ABNORMAL HIGH (ref 65–99)
Potassium: 4.6 mmol/L (ref 3.5–5.3)
SODIUM: 136 mmol/L (ref 135–146)

## 2015-09-19 NOTE — Addendum Note (Signed)
Addended by: Tonita Phoenix on: 09/19/2015 08:34 AM   Modules accepted: Orders

## 2015-09-21 ENCOUNTER — Other Ambulatory Visit (HOSPITAL_COMMUNITY): Payer: Medicare Other

## 2015-09-26 ENCOUNTER — Ambulatory Visit (HOSPITAL_COMMUNITY)
Admission: RE | Admit: 2015-09-26 | Discharge: 2015-09-26 | Disposition: A | Discharge status: Home / Self Care | Payer: Medicare Other | Source: Ambulatory Visit | Attending: Cardiology | Admitting: Cardiology

## 2015-09-26 ENCOUNTER — Encounter (HOSPITAL_COMMUNITY): Payer: Self-pay

## 2015-09-26 DIAGNOSIS — R0689 Other abnormalities of breathing: Secondary | ICD-10-CM | POA: Diagnosis not present

## 2015-09-26 DIAGNOSIS — R918 Other nonspecific abnormal finding of lung field: Secondary | ICD-10-CM | POA: Diagnosis not present

## 2015-09-26 DIAGNOSIS — M47894 Other spondylosis, thoracic region: Secondary | ICD-10-CM | POA: Diagnosis not present

## 2015-09-26 DIAGNOSIS — I2510 Atherosclerotic heart disease of native coronary artery without angina pectoris: Secondary | ICD-10-CM | POA: Insufficient documentation

## 2015-09-26 DIAGNOSIS — I700 Atherosclerosis of aorta: Secondary | ICD-10-CM | POA: Insufficient documentation

## 2015-09-26 DIAGNOSIS — I480 Paroxysmal atrial fibrillation: Secondary | ICD-10-CM | POA: Insufficient documentation

## 2015-09-26 DIAGNOSIS — I429 Cardiomyopathy, unspecified: Secondary | ICD-10-CM | POA: Insufficient documentation

## 2015-09-26 IMAGING — CT CT HEART MORP W/ CTA COR W/ SCORE W/ CA W/CM &/OR W/O CM
1 series · 1 of 1 positions shown · IV contrast (Iodine)
Comparison: 11/04/2008

CLINICAL DATA: Cardiomyopathy

EXAM:
Cardiac CTA
MEDICATIONS:
Sub lingual nitro. 4mg and lopressor 10mg
TECHNIQUE: The patient was scanned on a [employer] [REDACTED]ice scanner. Gantry
rotation speed was 270 msecs. Collimation was .9mm. A 100 kV
prospective scan was triggered in the descending thoracic aorta at
111 HU's with 5% padding centered around 78% of the R-R interval.
Average HR during the scan was 60 bpm. The 3D data set was
interpreted on a dedicated work station using MPR, MIP and VRT
modes. A total of 80cc of contrast was used.

[Series 5010: circ mpr · 1 of 1 slices shown]
[im 1/1]
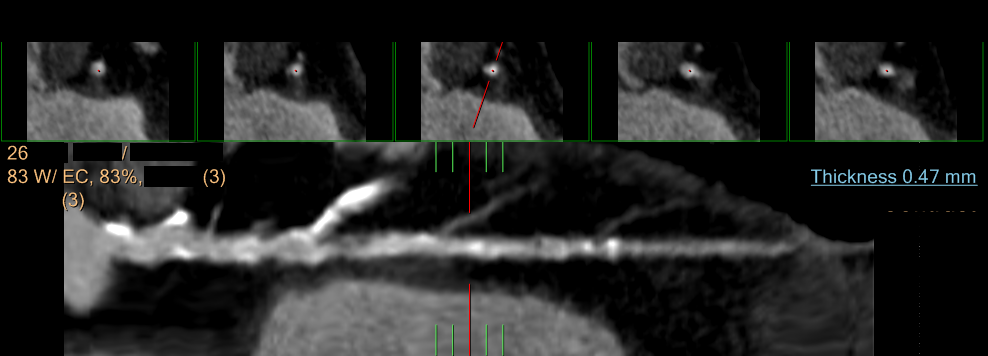

[1 of 1 positions shown; findings below may reference images not displayed]

FINDINGS: Non-cardiac: See separate report from [REDACTED]. No
significant findings on limited lung and soft tissue windows.

Calcium Score: Extreme calcification of LM and all major coronary
arteries. Score 1351 96% for age and sex matched controls

Coronary Arteries: Right dominant with no anomalies

LM:  Calcified Less than 50% stenosis

LAD: Calcified throughout less than 50% stenosis proximal mid and
distal

D1:  Calcified cannot r/o greater than 70% stenosis

Circumflex: Non dominant Calcified throughout possible greater than
70% mixed plaque in mid vessel after OM1 take off

OM1:  Cannot r/o greater than 70% calcified disease

OM2:  Less than 50% calcified disease

RCA: Dense calcification throughout. Less than 50% stenosis proximal
mid and distal

PDA:  Less than 50% calcified disease

PLA:  Less than 50% calcified disease
IMPRESSION: 1) Very high calcium score that limits CTA interpretation. Total
1351 including LM and all 3 major coronary arteries 96th percentile
for age and sex matched controls

2) Cannot r/o greater than 70% obstructive disease in mid
circumflex, OM1 and first diagonal Ronza

Dolina Rospudy Majoch

EXAM:
OVER-READ INTERPRETATION  CT CHEST

The following report is an over-read performed by radiologist Dr.
over-read does not include interpretation of cardiac or coronary
anatomy or pathology. The coronary CTA interpretation by the
cardiologist is attached.
FINDINGS: Mild aortic arch atherosclerotic calcifications.

Airway thickening is present, suggesting bronchitis or reactive
airways disease. Multilevel bridging spurring anterior to the
thoracic spine vertebral body column.
IMPRESSION: 1. Airway thickening is present, suggesting bronchitis or reactive
airways disease.
2. Atherosclerotic calcification of the aortic arch.
3. Thoracic spondylosis.

## 2015-09-26 MED ORDER — NITROGLYCERIN 0.4 MG SL SUBL
0.8000 mg | SUBLINGUAL_TABLET | Freq: Once | SUBLINGUAL | Status: AC
  Administered 2015-09-26: 0.8 mg via SUBLINGUAL
  Filled 2015-09-26: qty 25

## 2015-09-26 MED ORDER — METOPROLOL TARTRATE 1 MG/ML IV SOLN
INTRAVENOUS | Status: AC
  Filled 2015-09-26: qty 10

## 2015-09-26 MED ORDER — METOPROLOL TARTRATE 1 MG/ML IV SOLN
5.0000 mg | INTRAVENOUS | Status: DC | PRN
  Administered 2015-09-26: 5 mg via INTRAVENOUS
  Filled 2015-09-26: qty 5

## 2015-09-26 MED ORDER — IOHEXOL 350 MG/ML SOLN
80.0000 mL | Freq: Once | INTRAVENOUS | Status: AC | PRN
  Administered 2015-09-26: 80 mL via INTRAVENOUS

## 2015-09-26 MED ORDER — NITROGLYCERIN 0.4 MG SL SUBL
SUBLINGUAL_TABLET | SUBLINGUAL | Status: AC
Start: 2015-09-26 — End: 2015-09-26
  Filled 2015-09-26: qty 2

## 2015-09-26 NOTE — Progress Notes (Signed)
CT done. Tolerated well. Ginger ale and crackers given. Tolerated well. D/C home walking with walker with friend. Pt awake and alert. In no distress.

## 2015-09-27 ENCOUNTER — Telehealth: Payer: Self-pay

## 2015-09-27 NOTE — Telephone Encounter (Signed)
Informed patient of results and verbal understanding expressed.  L heart catheterization scheduled 10/03/2015 with Dr. Eldridge Dace. Reviewed pre-procedure instructions with patient and he has no questions. Patient will have to have labwork day-of, as he cannot drive and only has a ride on Wednesdays. Patient understands to hold metformin 24 hours before his cath and 48 hours after. Instructed patient to hold his NOAC for 2 days. Patient st he is no longer taking Xarelto, but Plavix instead. He st he could not afford the medication so he's taking his leftovers. Taking Plavix, instructed patient NOT TO HOLD, but that Dr. Mayford Knife would be consulted for further recommendations on medications. Per patient, called EC Ms. Katrinka Blazing and left message for her that procedure is scheduled Nov. 2 at 0930. She is his driver. Instructed her to call back if she has any further questions.

## 2015-09-27 NOTE — Telephone Encounter (Signed)
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  Patient needs to be set up for left heart cath.  He will need to hold NOAC 2 days prior to cath.

## 2015-09-28 ENCOUNTER — Other Ambulatory Visit: Payer: Self-pay | Admitting: Cardiology

## 2015-09-28 DIAGNOSIS — R9439 Abnormal result of other cardiovascular function study: Secondary | ICD-10-CM

## 2015-09-28 NOTE — H&P (Signed)
Cardiology Office Note   Date: 08/08/2015   ID: Levine, Scott 09/16/1941, MRN [id number]  PCP: Dina Rich, MD   Chief Complaint  Patient presents with  . PAF     History of Present Illness: Scott Levine is a 74 y.o. 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  He denies any CP, SOB, DOE, LE edema or syncope. He has chronic dizziness that he describes as problems with balance. He is currently is wearing a heart monitor to assess for PVC's and NSVT that were seen post cardioversion.   Past Medical History  Diagnosis Date  . HTN (hypertension)   . DM (diabetes mellitus)   . Atrial fibrillation   . Stroke   . Anemia   . Hyperlipidemia   . Hypogonadism male   . Gout   . Leg muscle spasm   . Cardiomyopathy     EF 35-40% echo    Past Surgical History  Procedure Laterality Date  . Appendectomy    . Tonsillectomy    . Other surgical history      GROWTH ON CHEST TAKEN OFF  . Cardioversion N/A 07/05/2015    Procedure: CARDIOVERSION; Surgeon: Wendall Stade, MD; Location: [facility] ENDOSCOPY; Service: Cardiovascular; Laterality: N/A;     Current Outpatient Prescriptions  Medication Sig Dispense Refill  . carvedilol (COREG) 3.125 MG tablet Take 1 tablet (3.125 mg total) by mouth 2 (two) times daily. 60 tablet 6  . glimepiride (AMARYL) 4 MG tablet Take 4 mg by mouth daily with breakfast.    . linagliptin (TRADJENTA) 5 MG TABS tablet Take 5 mg by mouth daily.    Marland Kitchen lisinopril (PRINIVIL,ZESTRIL) 20 MG tablet Take 20 mg by mouth daily.    . metFORMIN (GLUCOPHAGE) 1000 MG tablet Take 1,000 mg by mouth 2 (two) times daily with a meal.     .  rivaroxaban (XARELTO) 20 MG TABS tablet Take 20 mg by mouth daily with supper. To prevent stroke    . simvastatin (ZOCOR) 20 MG tablet Take 20 mg by mouth daily.     No current facility-administered medications for this visit.    Allergies: Penicillins    Social History: The patient  reports that he quit smoking about 51 years ago. He does not have any smokeless tobacco history on file. He reports that he does not drink alcohol or use illicit drugs.   Family History: The patient's family history includes Cancer in his mother and sister; Cancer - Other in his brother; Diabetes in his maternal grandfather, maternal grandmother, mother, and son; Heart attack in his father; Other in his brother, brother, daughter, and son.    ROS: Please see the history of present illness. Otherwise, review of systems are positive for none. All other systems are reviewed and negative.    PHYSICAL EXAM: VS: BP 110/60 mmHg  Pulse 63  Ht 5\' 8"  (1.727 m)  Wt 199 lb 9.6 oz (90.538 kg)  BMI 30.36 kg/m2  SpO2 95% , BMI Body mass index is 30.36 kg/(m^2). GEN: Well nourished, well developed, in no acute distress  HEENT: normal  Neck: no JVD, carotid bruits, or masses Cardiac: RRR; no murmurs, rubs, or gallops,no edema  Respiratory: clear to auscultation bilaterally, normal work of breathing GI: soft, nontender, nondistended, + BS MS: no deformity or atrophy  Skin: warm and dry, no rash Neuro: Strength and sensation are intact Psych: euthymic  mood, full affect   EKG: EKG was not ordered today.    Recent Labs: 06/27/2015: BUN 9; Creatinine, Ser 0.84; Hemoglobin 14.7; Platelets 279.0; Potassium 4.4; Sodium 135    Lipid Panel  Labs (Brief)    No results found for: CHOL, TRIG, HDL, CHOLHDL, VLDL, LDLCALC, LDLDIRECT     Wt Readings from Last 3 Encounters:  08/08/15 199 lb 9.6 oz (90.538 kg)  07/05/15 192 lb (87.091 kg)  07/02/15 196 lb (88.905 kg)      ASSESSMENT AND PLAN:  1. New onset atrial fibrillation on Xarelto s/p DCCV 07/05/2015. His CHA2DS2-VASc Score and unadjusted Ischemic Stroke Rate (% per year) is equal to 9.8 % stroke rate/year from a score of 6. Above score calculated as 1 point each if present [CHF, HTN, DM, Vascular=MI/PAD/Aortic Plaque, Age if 65-74, or Male]. Above score calculated as 2 points each if present [Age > 75, or Stroke/TIA/TE]. Continue Coreg/Xarelto 2. HTN - controlled on ACE I/BB 3. Type II DM 4. H/O CVA 5. DCM ? Tachycardia mediated from afib. Continue ACE I and add low dose coreg. He appears euvolemic on exam. Repeat echo scheduled for 1 month.   Current medicines are reviewed at length with the patient today. The patient does not have concerns regarding medicines.  The following changes have been made: no change  Labs/ tests ordered today: See above Assessment and Plan No orders of the defined types were placed in this encounter.    Current medicines are reviewed at length with the patient today. The patient does not have concerns regarding medicines.  The following changes have been made: no change  Labs/ tests ordered today: See above Assessment and Plan No orders of the defined types were placed in this encounter.    Disposition: FU with me in 6 months  Signed, Quintella Reichert, MD  [facility] [address] [place], [place], [place] [address] Phone: [phone]; Fax: [phone]    Addendum:  No change in H&P since patient last seen.  He has had persistence of dilated cardiomyopathy and nuclear stress test showed inferolateral scar.  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  He is now set up for left heart cath with possible PCI.  Signed: Armanda Magic, MD [facility] 09/28/2015

## 2015-10-03 ENCOUNTER — Encounter (HOSPITAL_COMMUNITY): Payer: Self-pay | Admitting: Interventional Cardiology

## 2015-10-03 ENCOUNTER — Encounter (HOSPITAL_COMMUNITY)
Admission: RE | Disposition: A | Discharge status: Home / Self Care | Payer: Self-pay | Source: Ambulatory Visit | Attending: Interventional Cardiology

## 2015-10-03 ENCOUNTER — Ambulatory Visit (HOSPITAL_COMMUNITY)
Admission: RE | Admit: 2015-10-03 | Discharge: 2015-10-04 | Disposition: A | Discharge status: Home / Self Care | Payer: Medicare Other | Source: Ambulatory Visit | Attending: Interventional Cardiology | Admitting: Interventional Cardiology

## 2015-10-03 DIAGNOSIS — I480 Paroxysmal atrial fibrillation: Secondary | ICD-10-CM | POA: Insufficient documentation

## 2015-10-03 DIAGNOSIS — Z87891 Personal history of nicotine dependence: Secondary | ICD-10-CM | POA: Diagnosis not present

## 2015-10-03 DIAGNOSIS — Z79899 Other long term (current) drug therapy: Secondary | ICD-10-CM | POA: Diagnosis not present

## 2015-10-03 DIAGNOSIS — E785 Hyperlipidemia, unspecified: Secondary | ICD-10-CM | POA: Diagnosis not present

## 2015-10-03 DIAGNOSIS — I429 Cardiomyopathy, unspecified: Secondary | ICD-10-CM | POA: Diagnosis not present

## 2015-10-03 DIAGNOSIS — E119 Type 2 diabetes mellitus without complications: Secondary | ICD-10-CM | POA: Insufficient documentation

## 2015-10-03 DIAGNOSIS — Z880 Allergy status to penicillin: Secondary | ICD-10-CM | POA: Insufficient documentation

## 2015-10-03 DIAGNOSIS — Z7984 Long term (current) use of oral hypoglycemic drugs: Secondary | ICD-10-CM | POA: Insufficient documentation

## 2015-10-03 DIAGNOSIS — Z8673 Personal history of transient ischemic attack (TIA), and cerebral infarction without residual deficits: Secondary | ICD-10-CM | POA: Diagnosis not present

## 2015-10-03 DIAGNOSIS — R9439 Abnormal result of other cardiovascular function study: Secondary | ICD-10-CM | POA: Diagnosis present

## 2015-10-03 DIAGNOSIS — I10 Essential (primary) hypertension: Secondary | ICD-10-CM | POA: Diagnosis not present

## 2015-10-03 DIAGNOSIS — I4819 Other persistent atrial fibrillation: Secondary | ICD-10-CM | POA: Diagnosis present

## 2015-10-03 DIAGNOSIS — Z7901 Long term (current) use of anticoagulants: Secondary | ICD-10-CM | POA: Diagnosis not present

## 2015-10-03 DIAGNOSIS — I2510 Atherosclerotic heart disease of native coronary artery without angina pectoris: Secondary | ICD-10-CM | POA: Insufficient documentation

## 2015-10-03 DIAGNOSIS — Z955 Presence of coronary angioplasty implant and graft: Secondary | ICD-10-CM

## 2015-10-03 HISTORY — DX: Calculus of kidney: N20.0

## 2015-10-03 HISTORY — DX: Pneumonia, unspecified organism: J18.9

## 2015-10-03 HISTORY — DX: Unspecified osteoarthritis, unspecified site: M19.90

## 2015-10-03 HISTORY — DX: Type 2 diabetes mellitus without complications: E11.9

## 2015-10-03 HISTORY — DX: Migraine, unspecified, not intractable, without status migrainosus: G43.909

## 2015-10-03 HISTORY — PX: CARDIAC CATHETERIZATION: SHX172

## 2015-10-03 LAB — GLUCOSE, CAPILLARY
GLUCOSE-CAPILLARY: 192 mg/dL — AB (ref 65–99)
GLUCOSE-CAPILLARY: 149 mg/dL — AB (ref 65–99)
GLUCOSE-CAPILLARY: 236 mg/dL — AB (ref 65–99)
Glucose-Capillary: 219 mg/dL — ABNORMAL HIGH (ref 65–99)

## 2015-10-03 LAB — CBC
HCT: 40.1 % (ref 39.0–52.0)
Hemoglobin: 13.6 g/dL (ref 13.0–17.0)
MCH: 32.2 pg (ref 26.0–34.0)
MCHC: 33.9 g/dL (ref 30.0–36.0)
MCV: 95 fL (ref 78.0–100.0)
PLATELETS: 228 10*3/uL (ref 150–400)
RBC: 4.22 MIL/uL (ref 4.22–5.81)
RDW: 12.6 % (ref 11.5–15.5)
WBC: 5.8 10*3/uL (ref 4.0–10.5)

## 2015-10-03 LAB — BASIC METABOLIC PANEL
ANION GAP: 6 (ref 5–15)
BUN: 7 mg/dL (ref 6–20)
CALCIUM: 9.4 mg/dL (ref 8.9–10.3)
CO2: 26 mmol/L (ref 22–32)
CREATININE: 0.74 mg/dL (ref 0.61–1.24)
Chloride: 107 mmol/L (ref 101–111)
GFR calc Af Amer: >60 mL/min (ref >60)
GLUCOSE: 230 mg/dL — AB (ref 65–99)
Potassium: 4.3 mmol/L (ref 3.5–5.1)
Sodium: 139 mmol/L (ref 135–145)

## 2015-10-03 LAB — PROTIME-INR
INR: 1.01 (ref 0.00–1.49)
PROTHROMBIN TIME: 13.5 s (ref 11.6–15.2)

## 2015-10-03 LAB — POCT ACTIVATED CLOTTING TIME: ACTIVATED CLOTTING TIME: 300 s

## 2015-10-03 SURGERY — LEFT HEART CATH AND CORONARY ANGIOGRAPHY
Anesthesia: LOCAL

## 2015-10-03 MED ORDER — GLIMEPIRIDE 4 MG PO TABS
4.0000 mg | ORAL_TABLET | Freq: Every day | ORAL | Status: DC
  Administered 2015-10-04: 07:00:00 4 mg via ORAL
  Filled 2015-10-03 (×2): qty 1

## 2015-10-03 MED ORDER — LIDOCAINE HCL (PF) 1 % IJ SOLN
INTRAMUSCULAR | Status: DC | PRN
  Administered 2015-10-03: 2 mL

## 2015-10-03 MED ORDER — HEPARIN SODIUM (PORCINE) 1000 UNIT/ML IJ SOLN
INTRAMUSCULAR | Status: DC | PRN
  Administered 2015-10-03: 6000 [IU] via INTRAVENOUS
  Administered 2015-10-03: 5000 [IU] via INTRAVENOUS

## 2015-10-03 MED ORDER — ANGIOPLASTY BOOK
Freq: Once | Status: AC
  Administered 2015-10-03: 22:00:00
  Filled 2015-10-03: qty 1

## 2015-10-03 MED ORDER — SIMVASTATIN 20 MG PO TABS
20.0000 mg | ORAL_TABLET | Freq: Every day | ORAL | Status: DC
  Administered 2015-10-03 – 2015-10-04 (×2): 20 mg via ORAL
  Filled 2015-10-03 (×2): qty 1

## 2015-10-03 MED ORDER — FENTANYL CITRATE (PF) 100 MCG/2ML IJ SOLN
INTRAMUSCULAR | Status: AC
  Filled 2015-10-03: qty 4

## 2015-10-03 MED ORDER — SODIUM CHLORIDE 0.9 % WEIGHT BASED INFUSION
1.0000 mL/kg/h | INTRAVENOUS | Status: DC
  Administered 2015-10-03: 18:00:00 1 mL/kg/h via INTRAVENOUS

## 2015-10-03 MED ORDER — LINAGLIPTIN 5 MG PO TABS
5.0000 mg | ORAL_TABLET | Freq: Every day | ORAL | Status: DC
  Administered 2015-10-04: 10:00:00 5 mg via ORAL
  Filled 2015-10-03: qty 1

## 2015-10-03 MED ORDER — ASPIRIN 81 MG PO CHEW
81.0000 mg | CHEWABLE_TABLET | ORAL | Status: AC
  Administered 2015-10-03: 81 mg via ORAL

## 2015-10-03 MED ORDER — VERAPAMIL HCL 2.5 MG/ML IV SOLN
INTRAVENOUS | Status: AC
Start: 2015-10-03 — End: 2015-10-03
  Filled 2015-10-03: qty 2

## 2015-10-03 MED ORDER — SODIUM CHLORIDE 0.9 % IJ SOLN: 3.0000 mL | Freq: Two times a day (BID) | INTRAMUSCULAR | Status: DC

## 2015-10-03 MED ORDER — SODIUM CHLORIDE 0.9 % IV SOLN: 250.0000 mL | INTRAVENOUS | Status: DC | PRN

## 2015-10-03 MED ORDER — ASPIRIN 81 MG PO CHEW
81.0000 mg | CHEWABLE_TABLET | Freq: Every day | ORAL | Status: DC
  Administered 2015-10-04: 81 mg via ORAL
  Filled 2015-10-03: qty 1

## 2015-10-03 MED ORDER — HEPARIN (PORCINE) IN NACL 2-0.9 UNIT/ML-% IJ SOLN
INTRAMUSCULAR | Status: AC
  Filled 2015-10-03: qty 1500

## 2015-10-03 MED ORDER — HEPARIN (PORCINE) IN NACL 2-0.9 UNIT/ML-% IJ SOLN
INTRAMUSCULAR | Status: DC | PRN
Start: 2015-10-03 — End: 2015-10-03
  Administered 2015-10-03: 14:00:00 via INTRA_ARTERIAL

## 2015-10-03 MED ORDER — CLOPIDOGREL BISULFATE 300 MG PO TABS
ORAL_TABLET | ORAL | Status: AC
  Filled 2015-10-03: qty 1

## 2015-10-03 MED ORDER — FENTANYL CITRATE (PF) 100 MCG/2ML IJ SOLN
INTRAMUSCULAR | Status: DC | PRN
  Administered 2015-10-03: 25 ug via INTRAVENOUS

## 2015-10-03 MED ORDER — ACETAMINOPHEN 325 MG PO TABS: 650.0000 mg | ORAL_TABLET | ORAL | Status: DC | PRN

## 2015-10-03 MED ORDER — CLOPIDOGREL BISULFATE 300 MG PO TABS
ORAL_TABLET | ORAL | Status: DC | PRN
  Administered 2015-10-03: 300 mg via ORAL

## 2015-10-03 MED ORDER — ONDANSETRON HCL 4 MG/2ML IJ SOLN: 4.0000 mg | Freq: Four times a day (QID) | INTRAMUSCULAR | Status: DC | PRN

## 2015-10-03 MED ORDER — LISINOPRIL 20 MG PO TABS
20.0000 mg | ORAL_TABLET | Freq: Every day | ORAL | Status: DC
  Administered 2015-10-04: 20 mg via ORAL
  Filled 2015-10-03: qty 2
  Filled 2015-10-03: qty 1

## 2015-10-03 MED ORDER — HEPARIN (PORCINE) IN NACL 2-0.9 UNIT/ML-% IJ SOLN
INTRAMUSCULAR | Status: DC | PRN
  Administered 2015-10-03: 14:00:00

## 2015-10-03 MED ORDER — SODIUM CHLORIDE 0.9 % IJ SOLN: 3.0000 mL | INTRAMUSCULAR | Status: DC | PRN

## 2015-10-03 MED ORDER — LIDOCAINE HCL (PF) 1 % IJ SOLN
INTRAMUSCULAR | Status: AC
  Filled 2015-10-03: qty 30

## 2015-10-03 MED ORDER — HEPARIN SODIUM (PORCINE) 1000 UNIT/ML IJ SOLN
INTRAMUSCULAR | Status: AC
Start: 2015-10-03 — End: 2015-10-03
  Filled 2015-10-03: qty 1

## 2015-10-03 MED ORDER — MIDAZOLAM HCL 2 MG/2ML IJ SOLN
INTRAMUSCULAR | Status: AC
  Filled 2015-10-03: qty 4

## 2015-10-03 MED ORDER — HEPARIN SODIUM (PORCINE) 1000 UNIT/ML IJ SOLN
INTRAMUSCULAR | Status: AC
  Filled 2015-10-03: qty 1

## 2015-10-03 MED ORDER — CLOPIDOGREL BISULFATE 75 MG PO TABS: 75.0000 mg | ORAL_TABLET | Freq: Every day | ORAL | Status: DC

## 2015-10-03 MED ORDER — INSULIN ASPART 100 UNIT/ML ~~LOC~~ SOLN: 5.0000 [IU] | Freq: Once | SUBCUTANEOUS | Status: DC

## 2015-10-03 MED ORDER — SODIUM CHLORIDE 0.9 % WEIGHT BASED INFUSION
3.0000 mL/kg/h | INTRAVENOUS | Status: DC
  Administered 2015-10-03: 3 mL/kg/h via INTRAVENOUS

## 2015-10-03 MED ORDER — CLOPIDOGREL BISULFATE 75 MG PO TABS
75.0000 mg | ORAL_TABLET | Freq: Every day | ORAL | Status: DC
  Administered 2015-10-04: 10:00:00 75 mg via ORAL
  Filled 2015-10-03: qty 1

## 2015-10-03 MED ORDER — ASPIRIN 81 MG PO CHEW
CHEWABLE_TABLET | ORAL | Status: AC
  Administered 2015-10-03: 81 mg via ORAL
  Filled 2015-10-03: qty 1

## 2015-10-03 MED ORDER — MIDAZOLAM HCL 2 MG/2ML IJ SOLN
INTRAMUSCULAR | Status: DC | PRN
  Administered 2015-10-03: 2 mg via INTRAVENOUS

## 2015-10-03 MED ORDER — CARVEDILOL 3.125 MG PO TABS
3.1250 mg | ORAL_TABLET | Freq: Two times a day (BID) | ORAL | Status: DC
  Administered 2015-10-03 – 2015-10-04 (×2): 3.125 mg via ORAL
  Filled 2015-10-03 (×2): qty 1

## 2015-10-03 MED ORDER — SODIUM CHLORIDE 0.9 % IJ SOLN
3.0000 mL | Freq: Two times a day (BID) | INTRAMUSCULAR | Status: DC
  Administered 2015-10-03: 18:00:00 3 mL via INTRAVENOUS

## 2015-10-03 MED ORDER — SODIUM CHLORIDE 0.9 % WEIGHT BASED INFUSION: 1.0000 mL/kg/h | INTRAVENOUS | Status: DC

## 2015-10-03 MED ORDER — IOHEXOL 350 MG/ML SOLN
INTRAVENOUS | Status: DC | PRN
  Administered 2015-10-03: 65 mL via INTRAVENOUS

## 2015-10-03 SURGICAL SUPPLY — 18 items
BALLN EUPHORA RX 3.0X15 (BALLOONS) ×2
BALLN ~~LOC~~ TREK RX 4.5X20 (BALLOONS) ×2
BALLOON EUPHORA RX 3.0X15 (BALLOONS) IMPLANT
BALLOON ~~LOC~~ TREK RX 4.5X20 (BALLOONS) IMPLANT
CATH INFINITI 5 FR JL3.5 (CATHETERS) ×2 IMPLANT
CATH INFINITI JR4 5F (CATHETERS) ×2 IMPLANT
DEVICE RAD COMP TR BAND LRG (VASCULAR PRODUCTS) ×2 IMPLANT
GLIDESHEATH SLEND SS 6F .021 (SHEATH) ×2 IMPLANT
GUIDE CATH RUNWAY 6FR FR4 (CATHETERS) ×1 IMPLANT
KIT ENCORE 26 ADVANTAGE (KITS) ×1 IMPLANT
KIT HEART LEFT (KITS) ×2 IMPLANT
PACK CARDIAC CATHETERIZATION (CUSTOM PROCEDURE TRAY) ×2 IMPLANT
STENT SYNERGY DES 4X32 (Permanent Stent) ×1 IMPLANT
TRANSDUCER W/STOPCOCK (MISCELLANEOUS) ×2 IMPLANT
TUBING CIL FLEX 10 FLL-RA (TUBING) ×2 IMPLANT
VALVE GUARDIAN II ~~LOC~~ HEMO (MISCELLANEOUS) ×1 IMPLANT
WIRE ASAHI PROWATER 180CM (WIRE) ×1 IMPLANT
WIRE SAFE-T 1.5MM-J .035X260CM (WIRE) ×2 IMPLANT

## 2015-10-03 NOTE — Progress Notes (Signed)
NCM consulted in regards to medicaction assistance for Xarelto.  Pt has insurance coverage and is not eligible for [employer] program and has used 30 day free coupon.  NCM provided prescription assistance program application for [employer] and [employer] and [employer] Information Program [place]).   No further CM needs communicated at this time.

## 2015-10-03 NOTE — Progress Notes (Signed)
TR BAND REMOVAL  LOCATION:    right radial  DEFLATED PER PROTOCOL:    Yes.    TIME BAND OFF / DRESSING APPLIED:    1815   SITE UPON ARRIVAL:    Level 0  SITE AFTER BAND REMOVAL:    Level 0  REVERSE ALLEN'S TEST:     positive  CIRCULATION SENSATION AND MOVEMENT:    Within Normal Limits   Yes.    COMMENTS:   Tolerated procedure well 

## 2015-10-03 NOTE — H&P (View-Only) (Signed)
Cardiology Office Note   Date: 08/08/2015   ID: [name], [name] 09/16/1941, MRN [id number]  PCP: Dina Rich, MD   Chief Complaint  Patient presents with  . PAF     History of Present Illness: Scott Levine is a 74 y.o. 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  He denies any CP, SOB, DOE, LE edema or syncope. He has chronic dizziness that he describes as problems with balance. He is currently is wearing a heart monitor to assess for PVC's and NSVT that were seen post cardioversion.   Past Medical History  Diagnosis Date  . HTN (hypertension)   . DM (diabetes mellitus)   . Atrial fibrillation   . Stroke   . Anemia   . Hyperlipidemia   . Hypogonadism male   . Gout   . Leg muscle spasm   . Cardiomyopathy     EF 35-40% echo    Past Surgical History  Procedure Laterality Date  . Appendectomy    . Tonsillectomy    . Other surgical history      GROWTH ON CHEST TAKEN OFF  . Cardioversion N/A 07/05/2015    Procedure: CARDIOVERSION; Surgeon: Wendall Stade, MD; Location: [facility] ENDOSCOPY; Service: Cardiovascular; Laterality: N/A;     Current Outpatient Prescriptions  Medication Sig Dispense Refill  . carvedilol (COREG) 3.125 MG tablet Take 1 tablet (3.125 mg total) by mouth 2 (two) times daily. 60 tablet 6  . glimepiride (AMARYL) 4 MG tablet Take 4 mg by mouth daily with breakfast.    . linagliptin (TRADJENTA) 5 MG TABS tablet Take 5 mg by mouth daily.    Marland Kitchen lisinopril (PRINIVIL,ZESTRIL) 20 MG tablet Take 20 mg by mouth daily.    . metFORMIN (GLUCOPHAGE) 1000 MG tablet Take 1,000 mg by mouth 2 (two) times daily with a meal.     .  rivaroxaban (XARELTO) 20 MG TABS tablet Take 20 mg by mouth daily with supper. To prevent stroke    . simvastatin (ZOCOR) 20 MG tablet Take 20 mg by mouth daily.     No current facility-administered medications for this visit.    Allergies: Penicillins    Social History: The patient  reports that he quit smoking about 51 years ago. He does not have any smokeless tobacco history on file. He reports that he does not drink alcohol or use illicit drugs.   Family History: The patient's family history includes Cancer in his mother and sister; Cancer - Other in his brother; Diabetes in his maternal grandfather, maternal grandmother, mother, and son; Heart attack in his father; Other in his brother, brother, daughter, and son.    ROS: Please see the history of present illness. Otherwise, review of systems are positive for none. All other systems are reviewed and negative.    PHYSICAL EXAM: VS: BP 110/60 mmHg  Pulse 63  Ht 5\' 8"  (1.727 m)  Wt 199 lb 9.6 oz (90.538 kg)  BMI 30.36 kg/m2  SpO2 95% , BMI Body mass index is 30.36 kg/(m^2). GEN: Well nourished, well developed, in no acute distress  HEENT: normal  Neck: no JVD, carotid bruits, or masses Cardiac: RRR; no murmurs, rubs, or gallops,no edema  Respiratory: clear to auscultation bilaterally, normal work of breathing GI: soft, nontender, nondistended, + BS MS: no deformity or atrophy  Skin: warm and dry, no rash Neuro: Strength and sensation are intact Psych: euthymic  mood, full affect   EKG: EKG was not ordered today.    Recent Labs: 06/27/2015: BUN 9; Creatinine, Ser 0.84; Hemoglobin 14.7; Platelets 279.0; Potassium 4.4; Sodium 135    Lipid Panel  Labs (Brief)    No results found for: CHOL, TRIG, HDL, CHOLHDL, VLDL, LDLCALC, LDLDIRECT     Wt Readings from Last 3 Encounters:  08/08/15 199 lb 9.6 oz (90.538 kg)  07/05/15 192 lb (87.091 kg)  07/02/15 196 lb (88.905 kg)      ASSESSMENT AND PLAN:  1. New onset atrial fibrillation on Xarelto s/p DCCV 07/05/2015. His CHA2DS2-VASc Score and unadjusted Ischemic Stroke Rate (% per year) is equal to 9.8 % stroke rate/year from a score of 6. Above score calculated as 1 point each if present [CHF, HTN, DM, Vascular=MI/PAD/Aortic Plaque, Age if 65-74, or Male]. Above score calculated as 2 points each if present [Age > 75, or Stroke/TIA/TE]. Continue Coreg/Xarelto 2. HTN - controlled on ACE I/BB 3. Type II DM 4. H/O CVA 5. DCM ? Tachycardia mediated from afib. Continue ACE I and add low dose coreg. He appears euvolemic on exam. Repeat echo scheduled for 1 month.   Current medicines are reviewed at length with the patient today. The patient does not have concerns regarding medicines.  The following changes have been made: no change  Labs/ tests ordered today: See above Assessment and Plan No orders of the defined types were placed in this encounter.    Current medicines are reviewed at length with the patient today. The patient does not have concerns regarding medicines.  The following changes have been made: no change  Labs/ tests ordered today: See above Assessment and Plan No orders of the defined types were placed in this encounter.    Disposition: FU with me in 6 months  Signed, TURNER,TRACI R, MD  [facility] [address], [place], [place] [address] Phone: [phone]; Fax: [phone]    Addendum:  No change in H&P since patient last seen.  He has had persistence of dilated cardiomyopathy and nuclear stress test showed inferolateral scar.  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  He is now set up for left heart cath with possible PCI.  Signed: Traci Turner, MD [facility] 09/28/2015          

## 2015-10-03 NOTE — Interval H&P Note (Signed)
Cath Lab Visit (complete for each Cath Lab visit)  Clinical Evaluation Leading to the Procedure:   ACS: No.  Non-ACS:    Anginal Classification: CCS II  Anti-ischemic medical therapy: Minimal Therapy (1 class of medications)  Non-Invasive Test Results: High-risk stress test findings: cardiac mortality >3%/year  Prior CABG: No previous CABG      History and Physical Interval Note:  10/03/2015 1:09 PM  Scott Levine  has presented today for surgery, with the diagnosis of cad  The various methods of treatment have been discussed with the patient and family. After consideration of risks, benefits and other options for treatment, the patient has consented to  Procedure(s): Left Heart Cath and Coronary Angiography (N/A) as a surgical intervention .  The patient's history has been reviewed, patient examined, no change in status, stable for surgery.  I have reviewed the patient's chart and labs.  Questions were answered to the patient's satisfaction.     Scott Levine S.

## 2015-10-04 ENCOUNTER — Telehealth: Payer: Self-pay | Admitting: Cardiology

## 2015-10-04 DIAGNOSIS — R931 Abnormal findings on diagnostic imaging of heart and coronary circulation: Secondary | ICD-10-CM

## 2015-10-04 DIAGNOSIS — I480 Paroxysmal atrial fibrillation: Secondary | ICD-10-CM | POA: Diagnosis not present

## 2015-10-04 DIAGNOSIS — E785 Hyperlipidemia, unspecified: Secondary | ICD-10-CM | POA: Diagnosis not present

## 2015-10-04 DIAGNOSIS — Z8673 Personal history of transient ischemic attack (TIA), and cerebral infarction without residual deficits: Secondary | ICD-10-CM | POA: Diagnosis not present

## 2015-10-04 DIAGNOSIS — I10 Essential (primary) hypertension: Secondary | ICD-10-CM

## 2015-10-04 DIAGNOSIS — I2510 Atherosclerotic heart disease of native coronary artery without angina pectoris: Secondary | ICD-10-CM | POA: Diagnosis not present

## 2015-10-04 LAB — CBC
HEMATOCRIT: 38.3 % — AB (ref 39.0–52.0)
HEMOGLOBIN: 12.8 g/dL — AB (ref 13.0–17.0)
MCH: 31.8 pg (ref 26.0–34.0)
MCHC: 33.4 g/dL (ref 30.0–36.0)
MCV: 95.3 fL (ref 78.0–100.0)
Platelets: 216 10*3/uL (ref 150–400)
RBC: 4.02 MIL/uL — AB (ref 4.22–5.81)
RDW: 12.6 % (ref 11.5–15.5)
WBC: 6.1 10*3/uL (ref 4.0–10.5)

## 2015-10-04 LAB — BASIC METABOLIC PANEL
ANION GAP: 9 (ref 5–15)
BUN: 9 mg/dL (ref 6–20)
CO2: 24 mmol/L (ref 22–32)
Calcium: 9.5 mg/dL (ref 8.9–10.3)
Chloride: 105 mmol/L (ref 101–111)
Creatinine, Ser: 0.86 mg/dL (ref 0.61–1.24)
GFR calc Af Amer: >60 mL/min (ref >60)
GLUCOSE: 203 mg/dL — AB (ref 65–99)
POTASSIUM: 4.1 mmol/L (ref 3.5–5.1)
Sodium: 138 mmol/L (ref 135–145)

## 2015-10-04 LAB — GLUCOSE, CAPILLARY
GLUCOSE-CAPILLARY: 204 mg/dL — AB (ref 65–99)
Glucose-Capillary: 191 mg/dL — ABNORMAL HIGH (ref 65–99)

## 2015-10-04 MED ORDER — METFORMIN HCL 1000 MG PO TABS: 1000.0000 mg | ORAL_TABLET | Freq: Two times a day (BID) | ORAL | Status: AC

## 2015-10-04 MED ORDER — ASPIRIN 81 MG PO CHEW: 81.0000 mg | CHEWABLE_TABLET | Freq: Every day | ORAL | Status: DC

## 2015-10-04 MED ORDER — RIVAROXABAN 20 MG PO TABS: 20.0000 mg | ORAL_TABLET | Freq: Every day | ORAL | Status: DC

## 2015-10-04 NOTE — Progress Notes (Signed)
   10/04/15 0900  Clinical Encounter Type  Visited With Patient  Visit Type Initial;Spiritual support;Social support  Referral From Nurse  Consult/Referral To Chaplain  Spiritual Encounters  Spiritual Needs Emotional  CH responded to consult to visit with pt; pt alert and welcoming; pt related several stories during visit; CH offered social and spiritual support. Erline Levine 9:31 AM

## 2015-10-04 NOTE — Progress Notes (Signed)
Inpatient Diabetes Program Recommendations  AACE/ADA: New Consensus Statement on Inpatient Glycemic Control (2015)  Target Ranges:  Prepandial:   less than 140 mg/dL      Peak postprandial:   less than 180 mg/dL (1-2 hours)      Critically ill patients:  140 - 180 mg/dL   Review of Glycemic Control  Diabetes history: DM 2 Outpatient Diabetes medications: Amaryl 4 mg, linagliptin 5 mg Current orders for Inpatient glycemic control: Amaryl 4 mg and linagliptin 5 mg  Inpatient Diabetes Program Recommendations:  Insulin - Basal: Please consider addition of basal insulin while here-lantus or levemir 10-15 units daily or HS HgbA1C: Please check a HgbA1C while here (last one done in 2008) Please also add moderate correction tidwc.  Thank you Lenor Coffin, RN, MSN, CDE  Diabetes Inpatient Program Office: [phone] Pager: [phone] 8:00 am to 5:00 pm

## 2015-10-04 NOTE — Telephone Encounter (Signed)
TCM per B Simmons  11/17 @ 10am / B Sharol Harness

## 2015-10-04 NOTE — Progress Notes (Signed)
Per [facility] [phone] Benefits check results: Eliquis 5mg  twice a day, 30 day supply @ retail pharmacy [phone], mail order 90-day supply $ 146.23. Xarelto 20mg  daily, 30-day supply@ retail pharmacy [phone], mail order 90-day supply $146.61. CM made [place] PA aware. Gae Gallop ,[place] [phone]

## 2015-10-04 NOTE — Progress Notes (Signed)
CARDIAC REHAB PHASE I   PRE:  Rate/Rhythm: 60 SR with PVCs     BP: sitting 133/48    SaO2:   MODE:  Ambulation: 440 ft   POST:  Rate/Rhythm: 84 SR with many PVCs/PACs    BP: sitting 164/62     SaO2:   Very difficult for pt to get to EOB independently (but declined assist). Able to stand with shoes on and walk with RW, no assist needed (although I used gait belt). Many PACs and PVCs but no afib noted. No c/o, pt sts he can walk a long way. To recliner. Ed completed and will send referral to [facility]. Incontinent on urine at end of education.  [phone]   Elissa Lovett Kristan CES, ACSM 10/04/2015 9:05 AM

## 2015-10-04 NOTE — Progress Notes (Signed)
CM received consult: Compare Eliquis to Xarelto to see which one is cheapest. Benefits check in process, CM to f/u with results. Gae Gallop RN,BSN,CM [phone]

## 2015-10-04 NOTE — Discharge Summary (Signed)
Physician Discharge Summary  Patient ID: Scott Levine MRN: [id number] DOB/AGE: 06-25-1941 74 y.o.   Primary Cardiologist: Dr. Mayford Knife  Admit date: 10/03/2015 Discharge date: 10/04/2015  Admission Diagnoses: Abnormal NST  Discharge Diagnoses:  Active Problems:   PAF (paroxysmal atrial fibrillation) (HCC)   Benign essential HTN   Dyslipidemia   Cardiomyopathy (HCC)   Abnormal nuclear stress test   CAD (coronary artery disease), native coronary artery   Discharged Condition: stable  Hospital Course: 74 y.o. male with history of PAF s/p DCCV to NSR 07/05/2015, HTN, Type II DM, dyslipidemia, CVA and DCM with EF 35-40%. He recently underwent a nuclear stress test which showed inferolateral scar. 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 Subsequently, he was referred for an outpatient LHC.  He presented to [facility] on 10/03[facility]15 for the planned procedure, per formed by Dr. Eldridge Dace. This demonstrated single vessel coronary artery disease in a very large RCA, with 90% stenosis in the mid portion, successfully treated with a drug-eluting stent. He tolerated the procedure well and left the cath lab in stable condition. He had no post cath complications and no chest pain. It was recommended that he be treated with triple therapy x 1 month with ASA, Plavix + Xarelto. After 4 weeks, he will need to discontinue his ASA. He was also continued on his ACE-I and CCB. He was instructed to hold his Metformin for 48 hrs.   It should also be noted that the patient reported difficulties affording Xarelto. Care Management was consulted  for medication assistance. A benefits check was conducted to compare price of Xarelto vs Eliquis, however there was no significant difference in co pay. He was continued on Xarelto. An application was placed for the prescription assistance program for [employer] and [employer] and [employer] Information Program [place]). Office samples  of Xarelto were also provided to assist until application is processed and it was confirmed that the patient showed up to pick up samples. Additional samples can be provided at time of f/u if needed.   Patient was last seen and examined by Dr. Mayford Knife who determined he was stable for discharge home. 2 week post hospital f/u has been arranged with Robbie Lis, PA-C.   Consults: None  Significant Diagnostic Studies:  Procedures    Coronary Stent Intervention   Left Heart Cath and Coronary Angiography    Conclusion     Mid RCA lesion, 90% stenosed. Post intervention with a 4.0 x 32 mm Synergy DES, postdilated to 4.6 mm, there is a 0% residual stenosis.  Normal LVEDP of 11 mm Hg. No significant aortic valve gradient.  Single vessel coronary artery disease in a very large RCA, successfully treated with a drug-eluting stent due to high risk stress test. Continue aggressive secondary prevention. He'll be on dual antiplatelet therapy for now. If he does go back on anticoagulation with Xarelto, would stop aspirin at that point and continue the clopidogrel with Xarelto.      Treatments: See Hospital Course  Discharge Exam: Blood pressure 133/48, pulse 65, temperature 98.1 F (36.7 C), temperature source Oral, resp. rate 18, height 5' 7.5" (1.715 m), weight 198 lb 6.6 oz (90 kg), SpO2 99 %.   Disposition: 01-Home or Self Care  Discharge Instructions    Amb Referral to Cardiac Rehabilitation    Complete by:  As directed   Diagnosis:  PCI Comment - to [place]     Diet - low sodium heart healthy  Complete by:  As directed      Increase activity slowly    Complete by:  As directed             Medication List    TAKE these medications        aspirin 81 MG chewable tablet  Chew 1 tablet (81 mg total) by mouth daily.     carvedilol 3.125 MG tablet  Commonly known as:  COREG  Take 1 tablet (3.125 mg total) by mouth 2 (two) times daily.     clopidogrel 75 MG tablet    Commonly known as:  PLAVIX  Take 75 mg by mouth daily.     glimepiride 4 MG tablet  Commonly known as:  AMARYL  Take 4 mg by mouth daily with breakfast.     linagliptin 5 MG Tabs tablet  Commonly known as:  TRADJENTA  Take 5 mg by mouth daily.     lisinopril 20 MG tablet  Commonly known as:  PRINIVIL,ZESTRIL  Take 20 mg by mouth daily.     metFORMIN 1000 MG tablet  Commonly known as:  GLUCOPHAGE  Take 1 tablet (1,000 mg total) by mouth 2 (two) times daily with a meal.  Start taking on:  10/05/2015     rivaroxaban 20 MG Tabs tablet  Commonly known as:  XARELTO  Take 1 tablet (20 mg total) by mouth daily with supper. To prevent stroke              simvastatin 20 MG tablet  Commonly known as:  ZOCOR  Take 20 mg by mouth daily.       Follow-up Information    Follow up with Robbie Lis, PA-C On 10/18/2015.   Specialties:  Cardiology, Radiology   Why:  10:00 AM (cardiology follow-up)   Contact information:   [address] [place] [address] [phone]      TIME SPENT ON DISCHARGE, INCLUDING PHYSICIAN TIME: >30 MINUTES  Signed: SIMMONS, BRITTAINY 10/04/2015, 3:00 PM

## 2015-10-04 NOTE — Progress Notes (Addendum)
SUBJECTIVE:  No complaints  OBJECTIVE:   Vitals:   Filed Vitals:   10/03/15 1800 10/03/15 1932 10/04/15 0316 10/04/15 0810  BP: 163/68 168/57 126/58 133/48  Pulse: 53 56 69 65  Temp:  98.3 F (36.8 C) 98.2 F (36.8 C) 98.1 F (36.7 C)  TempSrc:  Oral Oral Oral  Resp: Height:      Weight:   198 lb 6.6 oz (90 kg)   SpO2: 97% 98% 99% 99%   I&O's:   Intake/Output Summary (Last 24 hours) at 10/04/15 6962 Last data filed at 10/04/15 0700  Gross per 24 hour  Intake    600 ml  Output   1000 ml  Net   -400 ml   TELEMETRY: Reviewed telemetry pt in NSR:     PHYSICAL EXAM General: Well developed, well nourished, in no acute distress Head: Eyes PERRLA, No xanthomas.   Normal cephalic and atramatic  Lungs:   Clear bilaterally to auscultation and percussion. Heart:   HRRR S1 S2 Pulses are 2+ & equal. Abdomen: Bowel sounds are positive, abdomen soft and non-tender without masses Extremities:   No clubbing, cyanosis or edema.  DP +1.  Right radial cath site without hematoma and 2+ pulse Neuro: Alert and oriented X 3. Psych:  Good affect, responds appropriately   LABS: Basic Metabolic Panel:  Recent Labs  95/28/41 1023 10/04/15 0548  NA 139 138  K 4.3 4.1  CL 107 105  CO2 26 24  GLUCOSE 230* 203*  BUN 7 9  CREATININE 0.74 0.86  CALCIUM 9.4 9.5   Liver Function Tests: No results for input(s): AST, ALT, ALKPHOS, BILITOT, PROT, ALBUMIN in the last 72 hours. No results for input(s): LIPASE, AMYLASE in the last 72 hours. CBC:  Recent Labs  10/03/15 1023 10/04/15 0548  WBC 5.8 6.1  HGB 13.6 12.8*  HCT 40.1 38.3*  MCV 95.0 95.3  PLT 228 216   Cardiac Enzymes: No results for input(s): CKTOTAL, CKMB, CKMBINDEX, TROPONINI in the last 72 hours. BNP: Invalid input(s): POCBNP D-Dimer: No results for input(s): DDIMER in the last 72 hours. Hemoglobin A1C: No results for input(s): HGBA1C in the last 72 hours. Fasting Lipid Panel: No results for  input(s): CHOL, HDL, LDLCALC, TRIG, CHOLHDL, LDLDIRECT in the last 72 hours. Thyroid Function Tests: No results for input(s): TSH, T4TOTAL, T3FREE, THYROIDAB in the last 72 hours.  Invalid input(s): FREET3 Anemia Panel: No results for input(s): VITAMINB12, FOLATE, FERRITIN, TIBC, IRON, RETICCTPCT in the last 72 hours. Coag Panel:   Lab Results  Component Value Date   INR 1.01 10/03/2015   INR 1.5* 06/27/2015    RADIOLOGY: Ct Coronary Morp W/cta Cor W/score W/ca W/cm &/or Wo/cm  09/26/2015  ADDENDUM REPORT: 09/26/2015 11:55 CLINICAL DATA:  Cardiomyopathy EXAM: Cardiac CTA MEDICATIONS: Sub lingual nitro.  and lopressor  TECHNIQUE: The patient was scanned on a Philips 256 slice scanner. Gantry rotation speed was 270 msecs. Collimation was .9mm. A 100 kV prospective scan was triggered in the descending thoracic aorta at 111 HU's with 5% padding centered around 78% of the R-R interval. Average HR during the scan was 60 bpm. The 3D data set was interpreted on a dedicated work station using MPR, MIP and VRT modes. A total of 80cc of contrast was used. FINDINGS: Non-cardiac: See separate report from [facility]. No significant findings on limited lung and soft tissue windows. Calcium Score: Extreme calcification of LM and all major coronary arteries. Score 3013 96%  for age and sex matched controls Coronary Arteries: Right dominant with no anomalies LM:  Calcified Less than 50% stenosis LAD: Calcified throughout less than 50% stenosis proximal mid and distal D1:  Calcified cannot r/o greater than 70% stenosis Circumflex: Non dominant Calcified throughout possible greater than 70% mixed plaque in mid vessel after OM1 take off OM1:  Cannot r/o greater than 70% calcified disease OM2:  Less than 50% calcified disease RCA: Dense calcification throughout. Less than 50% stenosis proximal mid and distal PDA:  Less than 50% calcified disease PLA:  Less than 50% calcified disease IMPRESSION: 1) Very  high calcium score that limits CTA interpretation. Total 3013 including LM and all 3 major coronary arteries 96th percentile for age and sex matched controls 2) Cannot r/o greater than 70% obstructive disease in mid circumflex, OM1 and first diagonal branch [name] Electronically Signed   By: [name] M.D.   On: 09/26/2015 11:55  09/26/2015  EXAM: OVER-READ INTERPRETATION  CT CHEST The following report is an over-read performed by radiologist Dr. Jarrett Ables [facility] on 09/26/2015. This over-read does not include interpretation of cardiac or coronary anatomy or pathology. The coronary CTA interpretation by the cardiologist is attached. COMPARISON:  11/04/2008 FINDINGS: Mild aortic arch atherosclerotic calcifications. Airway thickening is present, suggesting bronchitis or reactive airways disease. Multilevel bridging spurring anterior to the thoracic spine vertebral body column. IMPRESSION: 1. Airway thickening is present, suggesting bronchitis or reactive airways disease. 2. Atherosclerotic calcification of the aortic arch. 3. Thoracic spondylosis. Electronically Signed: By: Gaylyn Rong M.D. On: 09/26/2015 10:06    ASSESSMENT AND PLAN:  1. New onset atrial fibrillation on Xarelto s/p DCCV 07/05/2015. Maintaining NSR.  His CHA2DS2-VASc Score and unadjusted Ischemic Stroke Rate (% per year) is equal to 9.8 % stroke rate/year from a score of 6. Above score calculated as 1 point each if present [CHF, HTN, DM, Vascular=MI/PAD/Aortic Plaque, Age if 65-74, or Male]. Above score calculated as 2 points each if present [Age > 75, or Stroke/TIA/TE]. Continue Coreg.  He stopped Xarelto due to cost.  Discussed with him that we need him to be on NOAC agent for afib.  Will discuss with Case management whether eliquis would be cheaper.  Pt has insurance coverage and is not eligible for [facility] program and has used 30 day free coupon. NCM provided prescription assistance program  application for [employer] and [employer] and [employer] Information Program [place]). 2. HTN - controlled on ACE I/BB 3. Type II DM 4. H/O CVA 5. DCM ? Tachycardia mediated from afib. Continue ACE I and add low dose coreg. Repeat echo in 2 months post revascularization.   6.  Abnormal nuclear stress test and LV dysfunction 7.  ASCAD with cath showing single vessel coronary artery disease in a very large RCA, successfully treated with a drug-eluting stent due to high risk stress test. Continue aggressive secondary prevention. He'll be on dual antiplatelet therapy plus NOAC for afib for 1 month then stop ASA and continue Plavix and NOAC. Continue BB/statin.   Patient is stable from cardiac standpoint for discharge home and outpt followup in 2 weeks with extender.   Quintella Reichert, MD  10/04/2015  9:03 AM

## 2015-10-05 NOTE — Telephone Encounter (Signed)
Patient contacted regarding discharge from [facility] on 10/04/2015.  Patient understands to follow up with provider B Sharol Harness on 11/17 at 10 am at [facility] office. Patient understands discharge instructions? yes Patient understands medications and regiment? Yes - denies any questions or concerns Patient understands to bring all medications to this visit? Yes

## 2015-10-18 ENCOUNTER — Encounter: Payer: Self-pay | Admitting: Cardiology

## 2015-10-18 ENCOUNTER — Ambulatory Visit (INDEPENDENT_AMBULATORY_CARE_PROVIDER_SITE_OTHER): Payer: Medicare Other | Admitting: Cardiology

## 2015-10-18 VITALS — BP 122/68 | HR 64 | Ht 67.5 in | Wt 197.1 lb

## 2015-10-18 DIAGNOSIS — I2510 Atherosclerotic heart disease of native coronary artery without angina pectoris: Secondary | ICD-10-CM | POA: Diagnosis not present

## 2015-10-18 MED ORDER — NITROGLYCERIN 0.4 MG SL SUBL: 0.4000 mg | SUBLINGUAL_TABLET | SUBLINGUAL | Status: AC | PRN

## 2015-10-18 NOTE — Progress Notes (Signed)
10/18/2015 Scott Levine   1941/04/06  [id number]  Primary Physician DOUGH,ROBERT, MD Primary Cardiologist: Dr. Mayford Knife   Reason for Visit/CC:  [facility] F/u for CAD  HPI:  74 y.o. male with history of PAF s/p DCCV to NSR 07/05/2015, HTN, Type II DM, dyslipidemia, CVA and DCM with EF 35-40%. He recently underwent a nuclear stress test which showed inferolateral scar. 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 Subsequently, he was referred for an outpatient LHC.  He presented to [facility] on 10/03/15 for the planned procedure, per formed by Dr. Eldridge Dace. This demonstrated single vessel coronary artery disease in a very large RCA, with 90% stenosis in the mid portion, successfully treated with a drug-eluting stent. He tolerated the procedure well and left the cath lab in stable condition. He had no post cath complications and no chest pain. It was recommended that he be treated with triple therapy x 1 month with ASA, Plavix + Xarelto. After 4 weeks, he will need to discontinue his ASA. He was also continued on his ACE-I and CCB. He was instructed to hold his Metformin for 48 hrs.   He presents back to clinic today for f/u. He reports that he has done well since discharge. He denies any recurrent CP or dyspnea. No recurrent symptoms of atrial fibrillation. No palpitations, dizziness, fatigue, syncope/ near syncope. He reports significant improvement since his PCI. He notes increased energy levels. He reports full medication compliance. No abnormal bleeding on triple therapy. He has had no issues obtaining Xarelto from his PCP.    Current Outpatient Prescriptions  Medication Sig Dispense Refill  . aspirin 81 MG chewable tablet Chew 1 tablet (81 mg total) by mouth daily.    . carvedilol (COREG) 3.125 MG tablet Take 1 tablet (3.125 mg total) by mouth 2 (two) times daily. 60 tablet 6  . clopidogrel (PLAVIX) 75 MG tablet Take 75 mg by mouth daily.    Marland Kitchen  glimepiride (AMARYL) 4 MG tablet Take 4 mg by mouth daily with breakfast.    . linagliptin (TRADJENTA) 5 MG TABS tablet Take 5 mg by mouth daily.    Marland Kitchen lisinopril (PRINIVIL,ZESTRIL) 20 MG tablet Take 20 mg by mouth daily.    . metFORMIN (GLUCOPHAGE) 1000 MG tablet Take 1 tablet (1,000 mg total) by mouth 2 (two) times daily with a meal.    . rivaroxaban (XARELTO) 20 MG TABS tablet Take 1 tablet (20 mg total) by mouth daily with supper. To prevent stroke 30 tablet 10  . simvastatin (ZOCOR) 20 MG tablet Take 20 mg by mouth daily.     No current facility-administered medications for this visit.    Allergies  Allergen Reactions  . Penicillins Rash    Social History   Social History  . Marital Status: Divorced    Spouse Name: N/A  . Number of Children: 3  . Years of Education: 12   Occupational History  . retired    Social History Main Topics  . Smoking status: Former Smoker -- 1.00 packs/day for 4 years    Types: Cigarettes, Cigars    Quit date: 06/26/1964  . Smokeless tobacco: Former Neurosurgeon    Quit date: 12/01/1965  . Alcohol Use: 0.0 oz/week    0 Standard drinks or equivalent per week     Comment: 10/03/2015 "nothing since  the early 2000's"  . Drug Use: No  . Sexual Activity: Yes   Other Topics Concern  .  Not on file   Social History Narrative     Review of Systems: General: negative for chills, fever, night sweats or weight changes.  Cardiovascular: negative for chest pain, dyspnea on exertion, edema, orthopnea, palpitations, paroxysmal nocturnal dyspnea or shortness of breath Dermatological: negative for rash Respiratory: negative for cough or wheezing Urologic: negative for hematuria Abdominal: negative for nausea, vomiting, diarrhea, bright red blood per rectum, melena, or hematemesis Neurologic: negative for visual changes, syncope, or dizziness All other systems reviewed and are otherwise negative except as noted above.    Blood pressure 122/68, pulse 64,  height 5' 7.5" (1.715 m), weight 197 lb 1.9 oz (89.413 kg).  General appearance: alert, cooperative and no distress Neck: no carotid bruit and no JVD Lungs: clear to auscultation bilaterally Heart: regular rate and rhythm, S1, S2 normal, no murmur, click, rub or gallop Extremities: trace LEE on the right Pulses: 2+ and symmetric Skin: warm and dry Neurologic: Grossly normal  EKG not performed.  ASSESSMENT AND PLAN:   1. CAD: s/p PCI + DES to the mid RCA. Stable w/o recurrent CP. No dyspnea. Continue Triple therapy with ASA + Plavix + Xarelto for 2 more weeks (4 weeks total). Will need to discontinue ASA on 11/01/15 and continue Plavix + Xarelto thereafter. Continue Coreg, simvastatin and lisinopril. PRN SL NGT ordered.   2. Chronic Systolic CHF: EF 62-22% on recent echo. Volume stable. No dyspnea, orthopnea or PND. Low sodium diet advised. Continue BB and ACE-I therapy.   3. PAF: s/p DCCV 07/2015. RRR on physical exam. No breakthrough symptoms of afib. Rate is controlled in the low 60s. Continue BB therapy for rate control and Xarelto for a/c.  4. HTN: BP well controlled  5. HLD: on statin therapy.  6. DM: followed by PCP.   PLAN  Stop ASA in 2 more weeks on 11/01/15. Continue Plavix + Xarelto therafter. Continue BB, statin and ACE-I. F/u with Dr. Mayford Knife in 6-8 weeks.   Robbie Lis PA-C 10/18/2015 10:18 AM

## 2015-10-18 NOTE — Patient Instructions (Signed)
Medication Instructions:  Please stop ASA after November 01, 2015.  Continue all other medications as listed.  Follow-Up: Follow up with Dr Mayford Knife in 6 to 8 weeks.  If you need a refill on your cardiac medications before your next appointment, please call your pharmacy.  Thank you for choosing [facility]!!

## 2015-11-01 ENCOUNTER — Other Ambulatory Visit: Payer: Self-pay | Admitting: *Deleted

## 2015-11-01 MED ORDER — CLOPIDOGREL BISULFATE 75 MG PO TABS: 75.0000 mg | ORAL_TABLET | Freq: Every day | ORAL | Status: DC

## 2015-12-10 ENCOUNTER — Telehealth: Payer: Self-pay | Admitting: Cardiology

## 2015-12-10 NOTE — Telephone Encounter (Signed)
Called pt and could not get an answer to inform pt that we did not have any Xarelto 20 mg tablets and if he would like to call back later this week to see if we have gotten any samples in.

## 2015-12-10 NOTE — Telephone Encounter (Signed)
New message      Patient calling the office for samples of medication:   1.  What medication and dosage are you requesting samples for?   xarelto 20mg   2.  Are you currently out of this medication?  No Pt want to pick up samples when he is seen on Monday the 16th.

## 2015-12-12 ENCOUNTER — Ambulatory Visit: Payer: Medicare Other | Admitting: Cardiology

## 2015-12-17 ENCOUNTER — Ambulatory Visit (INDEPENDENT_AMBULATORY_CARE_PROVIDER_SITE_OTHER): Payer: Medicare Other | Admitting: Cardiology

## 2015-12-17 ENCOUNTER — Encounter: Payer: Self-pay | Admitting: Cardiology

## 2015-12-17 VITALS — BP 120/62 | HR 60 | Ht 67.5 in

## 2015-12-17 DIAGNOSIS — I480 Paroxysmal atrial fibrillation: Secondary | ICD-10-CM

## 2015-12-17 NOTE — Patient Instructions (Signed)

## 2015-12-17 NOTE — Progress Notes (Signed)
12/17/2015 Scott Levine   Nov 21, 1941  [id number]  Primary Physician DOUGH,ROBERT, MD Primary Cardiologist: Dr. Mayford Knife   Reason for Visit/CC: F/u for CAD and PAF  HPI:  75 y.o. Male, followed by Dr. 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 He recently underwent a nuclear stress test which showed inferolateral scar. 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 Subsequently, he was referred for an outpatient LHC.  He presented to [facility] on 10/03/15 for the planned procedure, performed by Dr. Eldridge Dace. This demonstrated single vessel coronary artery disease in a very large RCA, with 90% stenosis in the mid portion, successfully treated with a drug-eluting stent.  It was recommended that he be treated with triple therapy x 1 month with ASA, Plavix + Xarelto. After 4 weeks, his ASA was discontinued and he was continued on Plavix and Xarelto. He was also continued on his ACE-I and CCB.    I evaluated him on 10/18/2015 for post hospital follow-up. At that time he was doing well without any complaints. He denied any recurrent chest pain or dyspnea and no recurrent symptoms of atrial fibrillation. He was also tolerating his medications well and denied any abnormal bleeding. He was instructed to follow-up Dr. Mayford Knife in 6-8 weeks, however he presents back to clinic on my schedule for follow-up.  He reports that he has continued to do well. He denies any recurrent chest pain or dyspnea. No symptoms of breakthrough atrial fibrillation. No palpitations, dizziness, fatigue, syncope/ near syncope. He reports full medication compliance. No abnormal bleeding on dual therapy.   Current Outpatient Prescriptions  Medication Sig Dispense Refill  . carvedilol (COREG) 3.125 MG tablet Take 1 tablet (3.125 mg total) by mouth 2 (two) times daily. 60 tablet 6  . clopidogrel (PLAVIX) 75 MG tablet Take 1  tablet (75 mg total) by mouth daily. 30 tablet 11  . glimepiride (AMARYL) 4 MG tablet Take 4 mg by mouth daily with breakfast.    . linagliptin (TRADJENTA) 5 MG TABS tablet Take 5 mg by mouth daily.    Marland Kitchen lisinopril (PRINIVIL,ZESTRIL) 20 MG tablet Take 20 mg by mouth daily.    . metFORMIN (GLUCOPHAGE) 1000 MG tablet Take 1 tablet (1,000 mg total) by mouth 2 (two) times daily with a meal.    . nitroGLYCERIN (NITROSTAT) 0.4 MG SL tablet Place 1 tablet (0.4 mg total) under the tongue every 5 (five) minutes as needed for chest pain. 25 tablet prn  . rivaroxaban (XARELTO) 20 MG TABS tablet Take 1 tablet (20 mg total) by mouth daily with supper. To prevent stroke 30 tablet 10  . simvastatin (ZOCOR) 20 MG tablet Take 20 mg by mouth daily.     No current facility-administered medications for this visit.    Allergies  Allergen Reactions  . Penicillins Rash    Social History   Social History  . Marital Status: Divorced    Spouse Name: N/A  . Number of Children: 3  . Years of Education: 12   Occupational History  . retired    Social History Main Topics  . Smoking status: Former Smoker -- 1.00 packs/day for 4 years    Types: Cigarettes, Cigars    Quit date: 06/26/1964  . Smokeless tobacco: Former Neurosurgeon    Quit date: 12/01/1965  . Alcohol Use: 0.0 oz/week    0 Standard drinks or equivalent per  week     Comment: 10/03/2015 "nothing since  the early 2000's"  . Drug Use: No  . Sexual Activity: Yes   Other Topics Concern  . Not on file   Social History Narrative     Review of Systems: General: negative for chills, fever, night sweats or weight changes.  Cardiovascular: negative for chest pain, dyspnea on exertion, edema, orthopnea, palpitations, paroxysmal nocturnal dyspnea or shortness of breath Dermatological: negative for rash Respiratory: negative for cough or wheezing Urologic: negative for hematuria Abdominal: negative for nausea, vomiting, diarrhea, bright red blood per rectum,  melena, or hematemesis Neurologic: negative for visual changes, syncope, or dizziness All other systems reviewed and are otherwise negative except as noted above.    Blood pressure 120/62, pulse 60, height 5' 7.5" (1.715 m).  General appearance: alert, cooperative and no distress Neck: no carotid bruit and no JVD Lungs: clear to auscultation bilaterally Heart: regular rate and rhythm, S1, S2 normal, no murmur, click, rub or gallop Extremities: no LEE Pulses: 2+ and symmetric Skin: warm and dry Neurologic: Grossly normal  EKG  NSR.   ASSESSMENT AND PLAN:   1. CAD: s/p PCI + DES to the mid RCA 10/2015. Stable w/o recurrent CP. No dyspnea.  ASA was discontinued after 1 month of triple therapy. Continue Plavix + Xarelto.  Continue Coreg, simvastatin and lisinopril. PRN SL NGT ordered.   2. Chronic Systolic CHF: EF 82-95% on recent echo. Volume stable. No dyspnea, orthopnea or PND. Low sodium diet advised. Continue BB and ACE-I therapy.   3. PAF: s/p DCCV 07/2015. EKG shows NSR. No breakthrough symptoms of afib. Rate is well controlled in the low 60s. Continue BB therapy for rate control and Xarelto for a/c. Samples were provided today.   4. HTN: BP well controlled  5. HLD: on statin therapy. Followed by PCP.  We will attempt to obtain most recent labs from PCP to review.   6. DM: followed by PCP.    PLAN  F/u with Dr. Mayford Knife in 6 months  Robbie Lis PA-C 12/17/2015 9:34 AM

## 2016-01-19 ENCOUNTER — Other Ambulatory Visit: Payer: Self-pay | Admitting: Cardiology

## 2016-02-04 ENCOUNTER — Telehealth: Payer: Self-pay | Admitting: Cardiology

## 2016-02-04 NOTE — Telephone Encounter (Signed)
Patient aware samples will be placed at the front desk for pick up. He will check with the pharmacy as to why the medication has gone up so much and let us know. I also made him aware of the assistance program.

## 2016-02-04 NOTE — Telephone Encounter (Signed)
New message     Patient calling the office for samples of medication:   1.  What medication and dosage are you requesting samples for?  xarelto 20mg   2.  Are you currently out of this medication? almost

## 2016-06-18 ENCOUNTER — Encounter: Payer: Self-pay | Admitting: Cardiology

## 2016-06-18 ENCOUNTER — Ambulatory Visit (INDEPENDENT_AMBULATORY_CARE_PROVIDER_SITE_OTHER): Payer: Medicare Other | Admitting: Cardiology

## 2016-06-18 VITALS — BP 140/72 | HR 68 | Ht 67.0 in | Wt 198.0 lb

## 2016-06-18 DIAGNOSIS — I2510 Atherosclerotic heart disease of native coronary artery without angina pectoris: Secondary | ICD-10-CM

## 2016-06-18 DIAGNOSIS — E785 Hyperlipidemia, unspecified: Secondary | ICD-10-CM

## 2016-06-18 DIAGNOSIS — I10 Essential (primary) hypertension: Secondary | ICD-10-CM | POA: Diagnosis not present

## 2016-06-18 DIAGNOSIS — I480 Paroxysmal atrial fibrillation: Secondary | ICD-10-CM

## 2016-06-18 DIAGNOSIS — I429 Cardiomyopathy, unspecified: Secondary | ICD-10-CM

## 2016-06-18 NOTE — Progress Notes (Signed)
Cardiology Office Note    Date:  06/18/2016   ID:  Kratos, Ruscitti January 13, 1941, MRN [id number]  PCP:  Dina Rich, MD  Cardiologist:  Armanda Magic, MD   Chief Complaint  Patient presents with  . Coronary Artery Disease  . Hypertension  . Atrial Fibrillation    History of Present Illness:  Scott Levine is a 75 y.o. 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 He  underwent a nuclear stress test for CP in 11/2015 which showed inferolateral scar. Coronary CTA showed Calcium score of >3000 with 3 vessel CAD including LM and >70% LCx .  He underwent cath showing single vessel obstructive coronary artery disease in a very large RCA, with 90% stenosis in the mid portion, successfully treated with a drug-eluting stent.He also has a 40% LCx and luminal irregularirites elsewhere.   It was recommended that he be treated with triple therapy x 1 month with ASA, Plavix + Xarelto. After 4 weeks, his ASA was discontinued and he was continued on Plavix and Xarelto. He was also continued on his ACE-I and CCB.   He is doing well today.  He denies any recurrent chest pain or dyspnea (escept when he has nasal congestion). No symptoms of breakthrough atrial fibrillation. No palpitations, fatigue, LE edema, syncope/ near syncope. He reports full medication compliance. Occasionally he will have some dizziness that he says feels like "swimy headedness".     Past Medical History  Diagnosis Date  . HTN (hypertension)   . PAF (paroxysmal atrial fibrillation) (HCC)     CHADS2VASC score is 6 on Xarelto  . Anemia   . Hyperlipidemia     LDL goal < 70  . Hypogonadism male   . Gout   . Leg muscle spasm   . Ischemic dilated cardiomyopathy     EF 35-40% echo  . Kidney stones 1960's    "passed them"  . Type II diabetes mellitus (HCC)   . Pneumonia X 1-2  . Stroke (HCC) ~ 05/12/2006; 05/17/2006; 07/18/2006    "small; big; small; can't walk;  weaker on my right since" (10/03/2015)  . Migraine     "when I was young"  . Arthritis     "all over"  . CAD (coronary artery disease), native coronary artery     Cath 10/2015 with 90% RCA s/p DES stent with residual 40% LCx and luminal irregularities elsewhere.    Past Surgical History  Procedure Laterality Date  . Appendectomy    . Tonsillectomy    . Soft tissue mass excision  ~ 1991    GROWTH ON CHEST TAKEN OFF; IT WOULD SWELL AND GET SORE; DR. SAID IT WASN'T CANCER"  . Cardioversion N/A 07/05/2015    Procedure: CARDIOVERSION;  Surgeon: Wendall Stade, MD;  Location: [facility] ENDOSCOPY;  Service: Cardiovascular;  Laterality: N/A;  . Cardiac catheterization N/A 10/03/2015    Procedure: Left Heart Cath and Coronary Angiography;  Surgeon: Corky Crafts, MD;  Location: [facility] INVASIVE CV LAB;  Service: Cardiovascular;  Laterality: N/A;  . Cardiac catheterization N/A 10/03/2015    Procedure: Coronary Stent Intervention;  Surgeon: Corky Crafts, MD;  Location: [facility] INVASIVE CV LAB;  Service: Cardiovascular;  Laterality: N/A;  . Carpal tunnel release Left ~ 1988    Current Medications: Outpatient Prescriptions Prior to Visit  Medication Sig Dispense Refill  . carvedilol (COREG) 3.125 MG tablet TAKE 1 TABLET (3.125 MG  TOTAL) BY MOUTH 2 (TWO) TIMES DAILY. 60 tablet 6  . clopidogrel (PLAVIX) 75 MG tablet Take 1 tablet (75 mg total) by mouth daily. 30 tablet 11  . glimepiride (AMARYL) 4 MG tablet Take 4 mg by mouth daily with breakfast.    . linagliptin (TRADJENTA) 5 MG TABS tablet Take 5 mg by mouth daily.    Marland Kitchen lisinopril (PRINIVIL,ZESTRIL) 20 MG tablet Take 20 mg by mouth daily.    . metFORMIN (GLUCOPHAGE) 1000 MG tablet Take 1 tablet (1,000 mg total) by mouth 2 (two) times daily with a meal.    . nitroGLYCERIN (NITROSTAT) 0.4 MG SL tablet Place 1 tablet (0.4 mg total) under the tongue every 5 (five) minutes as needed for chest pain. 25 tablet prn  . rivaroxaban (XARELTO) 20 MG TABS tablet  Take 1 tablet (20 mg total) by mouth daily with supper. To prevent stroke 30 tablet 10  . simvastatin (ZOCOR) 20 MG tablet Take 20 mg by mouth daily.     No facility-administered medications prior to visit.     Allergies:   Penicillins   Social History   Social History  . Marital Status: Divorced    Spouse Name: N/A  . Number of Children: 3  . Years of Education: 12   Occupational History  . retired    Social History Main Topics  . Smoking status: Former Smoker -- 1.00 packs/day for 4 years    Types: Cigarettes, Cigars    Quit date: 06/26/1964  . Smokeless tobacco: Former Neurosurgeon    Quit date: 12/01/1965  . Alcohol Use: 0.0 oz/week    0 Standard drinks or equivalent per week     Comment: 10/03/2015 "nothing since  the early 2000's"  . Drug Use: No  . Sexual Activity: Yes   Other Topics Concern  . None   Social History Narrative     Family History:  The patient's family history includes Cancer in his mother and sister; Cancer - Other in his brother; Diabetes in his maternal grandfather, maternal grandmother, mother, and son; Heart attack in his father; Other in his brother, brother, daughter, and son.   ROS:   Please see the history of present illness.    ROS All other systems reviewed and are negative.   PHYSICAL EXAM:   VS:  BP 140/72 mmHg  Pulse 68  Ht 5\' 7"  (1.702 m)  Wt 198 lb (89.812 kg)  BMI 31.00 kg/m2   GEN: Well nourished, well developed, in no acute distress HEENT: normal Neck: no JVD, carotid bruits, or masses Cardiac: RRR; no murmurs, rubs, or gallops,no edema.  Intact distal pulses bilaterally.  Respiratory:  clear to auscultation bilaterally, normal work of breathing GI: soft, nontender, nondistended, + BS MS: no deformity or atrophy Skin: warm and dry, no rash Neuro:  Alert and Oriented x 3, Strength and sensation are intact Psych: euthymic mood, full affect  Wt Readings from Last 3 Encounters:  06/18/16 198 lb (89.812 kg)  10/18/15 197 lb 1.9  oz (89.413 kg)  10/04/15 198 lb 6.6 oz (90 kg)      Studies/Labs Reviewed:   EKG:  EKG is not ordered today.    Recent Labs: 10/04/2015: BUN 9; Creatinine, Ser 0.86; Hemoglobin 12.8*; Platelets 216; Potassium 4.1; Sodium 138   Lipid Panel No results found for: CHOL, TRIG, HDL, CHOLHDL, VLDL, LDLCALC, LDLDIRECT  Additional studies/ records that were reviewed today include:  none    ASSESSMENT:    1. Coronary artery disease involving  native coronary artery of native heart without angina pectoris   2. Benign essential HTN   3. PAF (paroxysmal atrial fibrillation) (HCC)   4. Cardiomyopathy (HCC)   5. Dyslipidemia      PLAN:  In order of problems listed above:  1. ASCAD s/p DES to RCA with no angina.  Continue Plavix.  Not on ASA due to NOAC.  Continue statin and BB. 2. HTN - BP well controlled.  Continue BB and ACE I.  3. PAF - maintaining NSR.  Continue NOAC and BB. 4. Ischemic DCM - EF 35%. Repeat echo to assess for LV improvement since revascularization.  Continue BB and ARB. 5. Dyslipidemia - continue statin. LDL goal < 70.  Check FLP and ALT.     Medication Adjustments/Labs and Tests Ordered: Current medicines are reviewed at length with the patient today.  Concerns regarding medicines are outlined above.  Medication changes, Labs and Tests ordered today are listed in the Patient Instructions below.  There are no Patient Instructions on file for this visit.   Signed, Armanda Magic, MD  06/18/2016 11:48 AM    [facility] [address] [place], [place], [place]  [address] Phone: [phone]; Fax: [phone]

## 2016-06-18 NOTE — Patient Instructions (Signed)
Medication Instructions:  Your physician recommends that you continue on your current medications as directed. Please refer to the Current Medication list given to you today.   Labwork: Your physician recommends that you return for FASTING lab work in about a week.   Testing/Procedures: Your physician has requested that you have an echocardiogram. Echocardiography is a painless test that uses sound waves to create images of your heart. It provides your doctor with information about the size and shape of your heart and how well your heart's chambers and valves are working. This procedure takes approximately one hour. There are no restrictions for this procedure.  Follow-Up: Your physician wants you to follow-up in: 6 months with Dr. Mayford Knife. You will receive a reminder letter in the mail two months in advance. If you don't receive a letter, please call our office to schedule the follow-up appointment.   Any Other Special Instructions Will Be Listed Below (If Applicable).     If you need a refill on your cardiac medications before your next appointment, please call your pharmacy.

## 2016-07-01 ENCOUNTER — Other Ambulatory Visit: Payer: Medicare Other

## 2016-07-01 ENCOUNTER — Other Ambulatory Visit (HOSPITAL_COMMUNITY): Payer: Medicare Other

## 2016-07-01 DIAGNOSIS — R0989 Other specified symptoms and signs involving the circulatory and respiratory systems: Secondary | ICD-10-CM

## 2016-07-28 ENCOUNTER — Other Ambulatory Visit: Payer: Self-pay | Admitting: *Deleted

## 2016-07-28 MED ORDER — CLOPIDOGREL BISULFATE 75 MG PO TABS: 75.0000 mg | ORAL_TABLET | Freq: Every day | ORAL | 2 refills | Status: DC

## 2016-07-28 MED ORDER — CARVEDILOL 3.125 MG PO TABS: ORAL_TABLET | ORAL | 2 refills | Status: DC

## 2016-08-22 ENCOUNTER — Ambulatory Visit (HOSPITAL_COMMUNITY): Payer: Medicare Other | Attending: Cardiology

## 2016-08-22 ENCOUNTER — Other Ambulatory Visit: Payer: Self-pay

## 2016-08-22 DIAGNOSIS — I351 Nonrheumatic aortic (valve) insufficiency: Secondary | ICD-10-CM | POA: Insufficient documentation

## 2016-08-22 DIAGNOSIS — I429 Cardiomyopathy, unspecified: Secondary | ICD-10-CM | POA: Diagnosis not present

## 2016-08-22 DIAGNOSIS — I480 Paroxysmal atrial fibrillation: Secondary | ICD-10-CM | POA: Insufficient documentation

## 2016-08-22 DIAGNOSIS — I501 Left ventricular failure: Secondary | ICD-10-CM | POA: Insufficient documentation

## 2016-12-08 ENCOUNTER — Telehealth: Payer: Self-pay | Admitting: Cardiology

## 2016-12-08 MED ORDER — RIVAROXABAN 20 MG PO TABS: 20.0000 mg | ORAL_TABLET | Freq: Every day | ORAL | 0 refills | Status: DC

## 2016-12-08 NOTE — Telephone Encounter (Signed)
New message       *STAT* If patient is at the pharmacy, call can be transferred to refill team.   1. Which medications need to be refilled? (please list name of each medication and dose if known)  xarelto 20mg  2. Which pharmacy/location (including street and city if local pharmacy) is medication to be sent to? [facility] in [place]  3. Do they need a 30 day or 90 day supply? 30 day

## 2016-12-09 ENCOUNTER — Telehealth: Payer: Self-pay

## 2016-12-09 DIAGNOSIS — I480 Paroxysmal atrial fibrillation: Secondary | ICD-10-CM

## 2016-12-09 NOTE — Telephone Encounter (Signed)
-----   Message from Quintella Reichert, MD sent at 12/08/2016 11:07 AM EST ----- Regarding: RE: CBC and BMET labs Please make sure these are orderd  Traci ----- Message ----- From: Raul Del, RN Sent: 12/08/2016  10:33 AM To: Quintella Reichert, MD, Henrietta Dine, RN Subject: CBC and BMET labs                              It appears that pt had scheduled lab on 05/2016 same day of OV, but labs were not done. He is scheduled for an upcoming OV on 12/18/16 with Dr Mayford Knife can you please ensure that those labs are done since he needs a Xarelto refill. Thank you.

## 2016-12-09 NOTE — Telephone Encounter (Signed)
Labs ordered to be drawn 1/18.

## 2016-12-18 ENCOUNTER — Ambulatory Visit: Payer: Medicare Other | Admitting: Cardiology

## 2016-12-18 ENCOUNTER — Other Ambulatory Visit: Payer: Medicare Other

## 2017-01-01 ENCOUNTER — Ambulatory Visit (INDEPENDENT_AMBULATORY_CARE_PROVIDER_SITE_OTHER): Payer: Medicare Other | Admitting: Cardiology

## 2017-01-01 ENCOUNTER — Encounter: Payer: Self-pay | Admitting: Cardiology

## 2017-01-01 VITALS — BP 118/62 | HR 55 | Ht 67.0 in | Wt 196.8 lb

## 2017-01-01 DIAGNOSIS — I481 Persistent atrial fibrillation: Secondary | ICD-10-CM | POA: Diagnosis not present

## 2017-01-01 DIAGNOSIS — I10 Essential (primary) hypertension: Secondary | ICD-10-CM

## 2017-01-01 DIAGNOSIS — I4819 Other persistent atrial fibrillation: Secondary | ICD-10-CM

## 2017-01-01 DIAGNOSIS — I2510 Atherosclerotic heart disease of native coronary artery without angina pectoris: Secondary | ICD-10-CM

## 2017-01-01 DIAGNOSIS — E785 Hyperlipidemia, unspecified: Secondary | ICD-10-CM | POA: Diagnosis not present

## 2017-01-01 LAB — HEPATIC FUNCTION PANEL
ALT: 13 IU/L (ref 0–44)
AST: 21 IU/L (ref 0–40)
Albumin: 4.6 g/dL (ref 3.5–4.8)
Alkaline Phosphatase: 59 IU/L (ref 39–117)
BILIRUBIN, DIRECT: 0.14 mg/dL (ref 0.00–0.40)
Bilirubin Total: 0.5 mg/dL (ref 0.0–1.2)
Total Protein: 7.1 g/dL (ref 6.0–8.5)

## 2017-01-01 LAB — CBC WITH DIFFERENTIAL/PLATELET
BASOS: 0 %
Basophils Absolute: 0 10*3/uL (ref 0.0–0.2)
EOS (ABSOLUTE): 0.1 10*3/uL (ref 0.0–0.4)
EOS: 1 %
Hematocrit: 41.7 % (ref 37.5–51.0)
Hemoglobin: 14.2 g/dL (ref 13.0–17.7)
IMMATURE GRANS (ABS): 0 10*3/uL (ref 0.0–0.1)
IMMATURE GRANULOCYTES: 0 %
Lymphocytes Absolute: 1.9 10*3/uL (ref 0.7–3.1)
Lymphs: 25 %
MCH: 32.1 pg (ref 26.6–33.0)
MCHC: 34.1 g/dL (ref 31.5–35.7)
MCV: 94 fL (ref 79–97)
Monocytes Absolute: 0.4 10*3/uL (ref 0.1–0.9)
Monocytes: 5 %
NEUTROS PCT: 69 %
Neutrophils Absolute: 5.3 10*3/uL (ref 1.4–7.0)
PLATELETS: 269 10*3/uL (ref 150–379)
RBC: 4.42 x10E6/uL (ref 4.14–5.80)
RDW: 13 % (ref 12.3–15.4)
WBC: 7.7 10*3/uL (ref 3.4–10.8)

## 2017-01-01 LAB — LIPID PANEL
CHOL/HDL RATIO: 2.7 ratio (ref 0.0–5.0)
Cholesterol, Total: 139 mg/dL (ref 100–199)
HDL: 51 mg/dL (ref >39)
LDL CALC: 65 mg/dL (ref 0–99)
Triglycerides: 115 mg/dL (ref 0–149)
VLDL CHOLESTEROL CAL: 23 mg/dL (ref 5–40)

## 2017-01-01 LAB — BASIC METABOLIC PANEL
BUN/Creatinine Ratio: 14 (ref 10–24)
BUN: 13 mg/dL (ref 8–27)
CALCIUM: 9.3 mg/dL (ref 8.6–10.2)
CO2: 21 mmol/L (ref 18–29)
CREATININE: 0.94 mg/dL (ref 0.76–1.27)
Chloride: 97 mmol/L (ref 96–106)
GFR calc Af Amer: 90 mL/min/{1.73_m2} (ref >59)
GFR, EST NON AFRICAN AMERICAN: 78 mL/min/{1.73_m2} (ref >59)
Glucose: 150 mg/dL — ABNORMAL HIGH (ref 65–99)
POTASSIUM: 4.6 mmol/L (ref 3.5–5.2)
Sodium: 137 mmol/L (ref 134–144)

## 2017-01-01 NOTE — Patient Instructions (Signed)
Medication Instructions:  Your physician recommends that you continue on your current medications as directed. Please refer to the Current Medication list given to you today.   Labwork: TODAY: BMET, CBC, LFTs, Lipids  Testing/Procedures: None  Follow-Up: Your physician wants you to follow-up in: 6 months with Dr. Turner. You will receive a reminder letter in the mail two months in advance. If you don't receive a letter, please call our office to schedule the follow-up appointment.   Any Other Special Instructions Will Be Listed Below (If Applicable).     If you need a refill on your cardiac medications before your next appointment, please call your pharmacy.   

## 2017-01-01 NOTE — Progress Notes (Signed)
Cardiology Office Note    Date:  01/01/2017   ID:  [name], [name] [birth year], MRN [id number]  PCP:  Dina Rich, MD  Cardiologist:  Armanda Magic, MD   Chief Complaint  Patient presents with  . Coronary Artery Disease  . Hypertension  . Hyperlipidemia    History of Present Illness:  Scott Levine is a 76 y.o. male with history of PAF s/p DCCV to NSR 07/05/2015, HTN, Type II DM, dyslipidemia, CVA and DCM with EF 35-40%, ASCAD with single vessel obstructive coronary artery disease in a very large RCA, with 90% stenosis in the mid portion, successfully treated with a drug-eluting stent.He also has a 40% LCx and luminal irregularirites elsewhere.    He is doing well today.  He denies any recurrent chest pain or dyspnea. No symptoms of breakthrough atrial fibrillation. No palpitations, LE edema, syncope/ near syncope. He reports full medication compliance. He has chronic dizziness that he has had since his his CVA and is more like clumsiness.  He ambulates with a walker.   Past Medical History:  Diagnosis Date  . Anemia   . Arthritis    "all over"  . CAD (coronary artery disease), native coronary artery    Cath 10/2015 with 90% RCA s/p DES stent with residual 40% LCx and luminal irregularities elsewhere.  . Gout   . HTN (hypertension)   . Hyperlipidemia    LDL goal < 70  . Hypogonadism male   . Ischemic dilated cardiomyopathy (HCC)    EF 50-55% by echo 2017  . Kidney stones 1960's   "passed them"  . Leg muscle spasm   . Migraine    "when I was young"  . PAF (paroxysmal atrial fibrillation) (HCC)    CHADS2VASC score is 6 on Xarelto  . Pneumonia X 1-2  . Stroke (HCC) ~ 05/12/2006; 05/17/2006; 07/18/2006   "small; big; small; can't walk; weaker on my right since" (10/03/2015)  . Type II diabetes mellitus (HCC)     Past Surgical History:  Procedure Laterality Date  . APPENDECTOMY    . CARDIAC CATHETERIZATION N/A 10/03/2015   Procedure: Left Heart Cath and Coronary  Angiography;  Surgeon: Corky Crafts, MD;  Location: [facility] INVASIVE CV LAB;  Service: Cardiovascular;  Laterality: N/A;  . CARDIAC CATHETERIZATION N/A 10/03/2015   Procedure: Coronary Stent Intervention;  Surgeon: Corky Crafts, MD;  Location: [facility] INVASIVE CV LAB;  Service: Cardiovascular;  Laterality: N/A;  . CARDIOVERSION N/A 07/05/2015   Procedure: CARDIOVERSION;  Surgeon: Wendall Stade, MD;  Location: [facility] ENDOSCOPY;  Service: Cardiovascular;  Laterality: N/A;  . CARPAL TUNNEL RELEASE Left ~ 1988  . SOFT TISSUE MASS EXCISION  ~ 1991   GROWTH ON CHEST TAKEN OFF; IT WOULD SWELL AND GET SORE; DR. SAID IT WASN'T CANCER"  . TONSILLECTOMY      Current Medications: Outpatient Medications Prior to Visit  Medication Sig Dispense Refill  . carvedilol (COREG) 3.125 MG tablet TAKE 1 TABLET (3.125 MG TOTAL) BY MOUTH 2 (TWO) TIMES DAILY. 180 tablet 2  . clopidogrel (PLAVIX) 75 MG tablet Take 1 tablet (75 mg total) by mouth daily. 90 tablet 2  . glimepiride (AMARYL) 4 MG tablet Take 4 mg by mouth daily with breakfast.    . metFORMIN (GLUCOPHAGE) 1000 MG tablet Take 1 tablet (1,000 mg total) by mouth 2 (two) times daily with a meal.    . nitroGLYCERIN (NITROSTAT) 0.4 MG SL tablet Place 1 tablet (0.4 mg total) under the tongue every  5 (five) minutes as needed for chest pain. 25 tablet prn  . rivaroxaban (XARELTO) 20 MG TABS tablet Take 1 tablet (20 mg total) by mouth daily with supper. To prevent stroke 30 tablet 0  . simvastatin (ZOCOR) 20 MG tablet Take 20 mg by mouth daily.    Marland Kitchen lisinopril (PRINIVIL,ZESTRIL) 20 MG tablet Take 30 mg by mouth daily.     Marland Kitchen linagliptin (TRADJENTA) 5 MG TABS tablet Take 5 mg by mouth daily.     No facility-administered medications prior to visit.      Allergies:   Penicillins   Social History   Social History  . Marital status: Divorced    Spouse name: N/A  . Number of children: 3  . Years of education: 12   Occupational History  . retired    Social  History Main Topics  . Smoking status: Former Smoker    Packs/day: 1.00    Years: 4.00    Types: Cigarettes, Cigars    Quit date: 06/26/1964  . Smokeless tobacco: Former Neurosurgeon    Quit date: 12/01/1965  . Alcohol use 0.0 oz/week     Comment: 10/03/2015 "nothing since  the early 2000's"  . Drug use: No  . Sexual activity: Yes   Other Topics Concern  . None   Social History Narrative  . None     Family History:  The patient's family history includes Cancer in his mother and sister; Cancer - Other in his brother; Diabetes in his maternal grandfather, maternal grandmother, mother, and son; Heart attack in his father; Other in his brother, brother, daughter, and son.   ROS:   Please see the history of present illness.    ROS All other systems reviewed and are negative.  No flowsheet data found.     PHYSICAL EXAM:   VS:  BP 118/62   Pulse (!) 55   Ht 5\' 7"  (1.702 m)   Wt 196 lb 12.8 oz (89.3 kg)   BMI 30.82 kg/m    GEN: Well nourished, well developed, in no acute distress  HEENT: normal  Neck: no JVD, carotid bruits, or masses Cardiac: RRR; no murmurs, rubs, or gallops,no edema.  Intact distal pulses bilaterally.  Respiratory:  clear to auscultation bilaterally, normal work of breathing GI: soft, nontender, nondistended, + BS MS: no deformity or atrophy  Skin: warm and dry, no rash Neuro:  Alert and Oriented x 3, Strength and sensation are intact Psych: euthymic mood, full affect  Wt Readings from Last 3 Encounters:  01/01/17 196 lb 12.8 oz (89.3 kg)  06/18/16 198 lb (89.8 kg)  10/18/15 197 lb 1.9 oz (89.4 kg)      Studies/Labs Reviewed:   EKG:  EKG is not ordered today.   Recent Labs: No results found for requested labs within last 8760 hours.   Lipid Panel No results found for: CHOL, TRIG, HDL, CHOLHDL, VLDL, LDLCALC, LDLDIRECT  Additional studies/ records that were reviewed today include:  none    ASSESSMENT:    1. Persistent atrial fibrillation (HCC)     2. Benign essential HTN   3. Coronary artery disease involving native coronary artery of native heart without angina pectoris   4. Dyslipidemia      PLAN:  In order of problems listed above:  1. Persistent atrial fibrillation maintaining NSR.  He will continue on BB and Xarelto.  Check BMET and CBC. 2. HTN - Bp controlled on current meds.  He will continue on BB and ACE I.  Check BMET. 3. ASCAD - 90% RCA s/p PCI with residual nonobstructive disease of the LCx. He has no angina symptoms.  He is not on ASA due to Xarelto.  He will continue on Plavix and statin.  4. Dyslipidemia with LDL goal < 70.  Continue statin.  Check FLP and ALT.    Medication Adjustments/Labs and Tests Ordered: Current medicines are reviewed at length with the patient today.  Concerns regarding medicines are outlined above.  Medication changes, Labs and Tests ordered today are listed in the Patient Instructions below.  There are no Patient Instructions on file for this visit.   Signed, Armanda Magic, MD  01/01/2017 8:50 AM    [facility] [address] [place], [place], [place]  [address] Phone: [phone]; Fax: [phone]

## 2017-01-19 ENCOUNTER — Other Ambulatory Visit: Payer: Self-pay | Admitting: Cardiology

## 2017-01-19 NOTE — Telephone Encounter (Signed)
Pt last saw Dr Mayford Knife on 01/01/17, last labs 01/01/17 Creat 0.94, Age 76, weight 89.3kg, CrCl 83.13.  Based on Creatinine Clearance pt is on appropriate dosage of Xarelto 20mg  QD.  Will refill rx x 6 months.

## 2017-04-21 ENCOUNTER — Other Ambulatory Visit: Payer: Self-pay | Admitting: Cardiology

## 2017-05-14 ENCOUNTER — Other Ambulatory Visit: Payer: Self-pay | Admitting: Cardiology

## 2017-06-29 ENCOUNTER — Telehealth: Payer: Self-pay | Admitting: Cardiology

## 2017-06-29 MED ORDER — WARFARIN SODIUM 5 MG PO TABS: 5.0000 mg | ORAL_TABLET | Freq: Every day | ORAL | 3 refills | Status: DC

## 2017-06-29 NOTE — Telephone Encounter (Signed)
He will need to either pay his copay while in the donut hole until he is out of it in a few months, or he can switch to warfarin.

## 2017-06-29 NOTE — Telephone Encounter (Signed)
Already spoke to pt about assistance program and pt makes to much money ./cy

## 2017-06-29 NOTE — Telephone Encounter (Signed)
Per pt cannot afford xarelto per [facility] start warfarin 5 mg every pm and take xarelto  20 mg x 3 days in  addition to warfarin appt made for coumadin clinic  On 07-06-17 at 2:30 pm

## 2017-06-29 NOTE — Telephone Encounter (Signed)
New message   Pt states this is confidential and refused to provide additional information. Requests a call back.

## 2017-06-29 NOTE — Telephone Encounter (Signed)
Pt is in the donut hole - he will be responsible for ~$400-450 out of pocket before his insurance will pay for most of the medication again. The only other potential option would be if he calls the patient assistance line for Xarelto [phone] to see if he qualifies for any assistance while in the donut hole. If pt does not qualify and he cannot afford copay while in the donut hole, would need to switch to warfarin.

## 2017-06-29 NOTE — Telephone Encounter (Signed)
Please send to [facility] in coumadin clinic to see what she can do

## 2017-06-29 NOTE — Telephone Encounter (Signed)
Per pt cannot afford Xarelto it went from $45.00 to 148.00 a month needs cheaper med .Will forward to Dr Mayford Knife for review .Zack Seal

## 2017-07-07 ENCOUNTER — Ambulatory Visit (INDEPENDENT_AMBULATORY_CARE_PROVIDER_SITE_OTHER): Payer: Medicare Other

## 2017-07-07 DIAGNOSIS — I481 Persistent atrial fibrillation: Secondary | ICD-10-CM | POA: Diagnosis not present

## 2017-07-07 DIAGNOSIS — Z7901 Long term (current) use of anticoagulants: Secondary | ICD-10-CM | POA: Insufficient documentation

## 2017-07-07 DIAGNOSIS — I4819 Other persistent atrial fibrillation: Secondary | ICD-10-CM

## 2017-07-07 DIAGNOSIS — I2510 Atherosclerotic heart disease of native coronary artery without angina pectoris: Secondary | ICD-10-CM | POA: Diagnosis not present

## 2017-07-07 LAB — POCT INR: INR: 1.1

## 2017-07-07 NOTE — Patient Instructions (Signed)

## 2017-07-13 ENCOUNTER — Ambulatory Visit (INDEPENDENT_AMBULATORY_CARE_PROVIDER_SITE_OTHER): Payer: Medicare Other

## 2017-07-13 DIAGNOSIS — I4819 Other persistent atrial fibrillation: Secondary | ICD-10-CM

## 2017-07-13 DIAGNOSIS — I481 Persistent atrial fibrillation: Secondary | ICD-10-CM | POA: Diagnosis not present

## 2017-07-13 DIAGNOSIS — I2510 Atherosclerotic heart disease of native coronary artery without angina pectoris: Secondary | ICD-10-CM

## 2017-07-13 DIAGNOSIS — Z7901 Long term (current) use of anticoagulants: Secondary | ICD-10-CM | POA: Diagnosis not present

## 2017-07-13 LAB — POCT INR: INR: 1.4

## 2017-07-20 ENCOUNTER — Ambulatory Visit (INDEPENDENT_AMBULATORY_CARE_PROVIDER_SITE_OTHER): Payer: Medicare Other | Admitting: *Deleted

## 2017-07-20 DIAGNOSIS — I2510 Atherosclerotic heart disease of native coronary artery without angina pectoris: Secondary | ICD-10-CM | POA: Diagnosis not present

## 2017-07-20 DIAGNOSIS — I481 Persistent atrial fibrillation: Secondary | ICD-10-CM | POA: Diagnosis not present

## 2017-07-20 DIAGNOSIS — Z7901 Long term (current) use of anticoagulants: Secondary | ICD-10-CM | POA: Diagnosis not present

## 2017-07-20 DIAGNOSIS — I4819 Other persistent atrial fibrillation: Secondary | ICD-10-CM

## 2017-07-20 LAB — POCT INR: INR: 2.1

## 2017-07-28 ENCOUNTER — Ambulatory Visit (INDEPENDENT_AMBULATORY_CARE_PROVIDER_SITE_OTHER): Payer: Medicare Other | Admitting: *Deleted

## 2017-07-28 DIAGNOSIS — I4819 Other persistent atrial fibrillation: Secondary | ICD-10-CM

## 2017-07-28 DIAGNOSIS — I481 Persistent atrial fibrillation: Secondary | ICD-10-CM

## 2017-07-28 DIAGNOSIS — I2510 Atherosclerotic heart disease of native coronary artery without angina pectoris: Secondary | ICD-10-CM

## 2017-07-28 DIAGNOSIS — Z7901 Long term (current) use of anticoagulants: Secondary | ICD-10-CM

## 2017-07-28 LAB — POCT INR: INR: 2.9

## 2017-07-29 ENCOUNTER — Encounter: Payer: Self-pay | Admitting: *Deleted

## 2017-08-07 ENCOUNTER — Other Ambulatory Visit: Payer: Self-pay | Admitting: Pharmacist

## 2017-08-07 ENCOUNTER — Ambulatory Visit (INDEPENDENT_AMBULATORY_CARE_PROVIDER_SITE_OTHER): Payer: Medicare Other | Admitting: Pharmacist

## 2017-08-07 DIAGNOSIS — I4819 Other persistent atrial fibrillation: Secondary | ICD-10-CM

## 2017-08-07 DIAGNOSIS — Z7901 Long term (current) use of anticoagulants: Secondary | ICD-10-CM

## 2017-08-07 DIAGNOSIS — I481 Persistent atrial fibrillation: Secondary | ICD-10-CM

## 2017-08-07 LAB — POCT INR: INR: 2.3

## 2017-08-07 MED ORDER — WARFARIN SODIUM 5 MG PO TABS: ORAL_TABLET | ORAL | 2 refills | Status: DC

## 2017-08-17 NOTE — Progress Notes (Signed)
Cardiology Office Note:    Date:  08/18/2017   ID:  Scott, Levine 01-10-1941, MRN [id number]  PCP:  Olive Bass, MD  Cardiologist:  Armanda Magic, MD   Referring MD: Olive Bass, MD   Chief Complaint  Patient presents with  . Coronary Artery Disease  . Hypertension  . Hyperlipidemia  . Atrial Fibrillation    History of Present Illness:    Scott Levine is a 76 y.o. male with a hx of persistent atrial fibrillation s/p DCCV to NSR 07/05/2015, HTN, Type II DM, dyslipidemia, CVA and DCM with EF 35-40%.  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   he is here today for followup and is doing well.  He denies any chest pain or pressure, SOB, DOE, PND, orthopnea, LE edema, dizziness, palpitations or syncope.    Past Medical History:  Diagnosis Date  . Anemia   . Arthritis    "all over"  . CAD (coronary artery disease), native coronary artery    Cath 10/2015 with 90% RCA s/p DES stent with residual 40% LCx and luminal irregularities elsewhere.  . Gout   . HTN (hypertension)   . Hyperlipidemia    LDL goal < 70  . Hypogonadism male   . Ischemic dilated cardiomyopathy (HCC)    EF 50-55% by echo 2017  . Kidney stones 1960's   "passed them"  . Leg muscle spasm   . Migraine    "when I was young"  . PAF (paroxysmal atrial fibrillation) (HCC)    CHADS2VASC score is 6 on Xarelto  . Pneumonia X 1-2  . Stroke (HCC) ~ 05/12/2006; 05/17/2006; 07/18/2006   "small; big; small; can't walk; weaker on my right since" (10/03/2015)  . Type II diabetes mellitus (HCC)     Past Surgical History:  Procedure Laterality Date  . APPENDECTOMY    . CARDIAC CATHETERIZATION N/A 10/03/2015   Procedure: Left Heart Cath and Coronary Angiography;  Surgeon: Corky Crafts, MD;   Location: [facility] INVASIVE CV LAB;  Service: Cardiovascular;  Laterality: N/A;  . CARDIAC CATHETERIZATION N/A 10/03/2015   Procedure: Coronary Stent Intervention;  Surgeon: Corky Crafts, MD;  Location: [facility] INVASIVE CV LAB;  Service: Cardiovascular;  Laterality: N/A;  . CARDIOVERSION N/A 07/05/2015   Procedure: CARDIOVERSION;  Surgeon: Wendall Stade, MD;  Location: [facility] ENDOSCOPY;  Service: Cardiovascular;  Laterality: N/A;  . CARPAL TUNNEL RELEASE Left 1988  . SOFT TISSUE MASS EXCISION  1991   GROWTH ON CHEST TAKEN OFF; IT WOULD SWELL AND GET SORE; DR. SAID IT WASN'T CANCER"  . TONSILLECTOMY      Current Medications: Current Meds  Medication Sig  . carvedilol (COREG) 3.125 MG tablet TAKE 1 TABLET (3.125 MG TOTAL) BY MOUTH 2 (TWO) TIMES DAILY.  Marland Kitchen clopidogrel (PLAVIX) 75 MG tablet TAKE 1 TABLET (75 MG TOTAL) BY MOUTH DAILY.  Marland Kitchen glimepiride (AMARYL) 4 MG tablet Take 4 mg by mouth daily with breakfast.  . lisinopril (PRINIVIL,ZESTRIL) 30 MG tablet TAKE 1 TABLET BY MOUTH EVERY MORNING FOR HIGH BLOOD PRESSURE  . metFORMIN (GLUCOPHAGE) 1000 MG tablet Take 1 tablet (1,000 mg total) by mouth 2 (two) times daily with a meal.  . nitroGLYCERIN (NITROSTAT) 0.4 MG SL tablet Place 1 tablet (0.4 mg total) under the tongue every 5 (  five) minutes as needed for chest pain.  . simvastatin (ZOCOR) 20 MG tablet Take 20 mg by mouth daily.  Marland Kitchen warfarin (COUMADIN) 5 MG tablet Take 1.5 to 2 tablets daily as directed by Coumadin Clinic     Allergies:   Penicillins   Social History   Social History  . Marital status: Divorced    Spouse name: N/A  . Number of children: 3  . Years of education: 12   Occupational History  . retired    Social History Main Topics  . Smoking status: Former Smoker    Packs/day: 1.00    Years: 4.00    Types: Cigarettes, Cigars    Quit date: 06/26/1964  . Smokeless tobacco: Former Neurosurgeon    Quit date: 12/01/1965  . Alcohol use 0.0 oz/week     Comment: 10/03/2015 "nothing since  the  early 2000's"  . Drug use: No  . Sexual activity: Yes   Other Topics Concern  . None   Social History Narrative  . None     Family History: The patient's family history includes Breast cancer in his mother and sister; Diabetes in his maternal grandfather, maternal grandmother, mother, and son; Healthy in his brother and brother; Heart attack in his father; Liver cancer in his brother; Other in his daughter and son.  ROS:   Please see the history of present illness.     All other systems reviewed and are negative.  EKGs/Labs/Other Studies Reviewed:    The following studies were reviewed today: none  EKG:  EKG is ordered today and showed NSR at 64bpm with no ST changes.  Recent Labs: 01/01/2017: ALT 13; BUN 13; Creatinine, Ser 0.94; Hemoglobin 14.2; Platelets 269; Potassium 4.6; Sodium 137   Recent Lipid Panel    Component Value Date/Time   CHOL 139 01/01/2017 0900   TRIG 115 01/01/2017 0900   HDL 51 01/01/2017 0900   CHOLHDL 2.7 01/01/2017 0900   LDLCALC 65 01/01/2017 0900    Physical Exam:    VS:  BP 132/64   Pulse 64   Ht 5\' 7"  (1.702 m)   Wt 195 lb (88.5 kg)   BMI 30.54 kg/m     Wt Readings from Last 3 Encounters:  08/18/17 195 lb (88.5 kg)  01/01/17 196 lb 12.8 oz (89.3 kg)  06/18/16 198 lb (89.8 kg)     GEN:  Well nourished, well developed in no acute distress HEENT: Normal NECK: No JVD; No carotid bruits LYMPHATICS: No lymphadenopathy CARDIAC: RRR, no murmurs, rubs, gallops RESPIRATORY:  Clear to auscultation without rales, wheezing or rhonchi  ABDOMEN: Soft, non-tender, non-distended MUSCULOSKELETAL:  No edema; No deformity  SKIN: Warm and dry NEUROLOGIC:  Alert and oriented x 3 PSYCHIATRIC:  Normal affect   ASSESSMENT:    1. Persistent atrial fibrillation (HCC)   2. Benign essential HTN   3. Coronary artery disease involving native coronary artery of native heart without angina pectoris   4. Dyslipidemia   5. Ischemic cardiomyopathy     PLAN:    In order of problems listed above:  1. Persistent atrial fibrillation - he remains in NSR on exam today. He will continue on warfarin.  He is followed in Coumadin clinic. He was on carvedilol but did not want to get swimmy headed so he stopped it.   2. HTN - her BP is well controlled on exam today. He will continue on  Lisinopril.   3. ASCAD - single vessel obstructive coronary artery disease in a  very large RCA, with 90% stenosis in the mid portion, successfully treated with a drug-eluting stent 10/2015 with residual 40% LCx and luminal irregularirites elsewhere. He has not had any anginal symptoms since I saw him last. He will continue on Plavix and statin. No ASA due to warfarin.   4. Dyslipidemia - his LDL goal is < 70. He will continue on simvastatin  daily.  His LDL was at goal at 65 in Feb.  Will repeat FLp and ALT.   5. Ischemic DCM - EF 50-55% by echo 08/2016   Medication Adjustments/Labs and Tests Ordered: Current medicines are reviewed at length with the patient today.  Concerns regarding medicines are outlined above.  No orders of the defined types were placed in this encounter.  No orders of the defined types were placed in this encounter.   Signed, Armanda Magic, MD  08/18/2017 1:16 PM    [facility]

## 2017-08-18 ENCOUNTER — Ambulatory Visit (INDEPENDENT_AMBULATORY_CARE_PROVIDER_SITE_OTHER): Payer: Medicare Other | Admitting: Cardiology

## 2017-08-18 ENCOUNTER — Ambulatory Visit (INDEPENDENT_AMBULATORY_CARE_PROVIDER_SITE_OTHER): Payer: Medicare Other

## 2017-08-18 ENCOUNTER — Encounter: Payer: Self-pay | Admitting: Cardiology

## 2017-08-18 VITALS — BP 132/64 | HR 64 | Ht 67.0 in | Wt 195.0 lb

## 2017-08-18 DIAGNOSIS — I2510 Atherosclerotic heart disease of native coronary artery without angina pectoris: Secondary | ICD-10-CM | POA: Diagnosis not present

## 2017-08-18 DIAGNOSIS — Z7901 Long term (current) use of anticoagulants: Secondary | ICD-10-CM

## 2017-08-18 DIAGNOSIS — E785 Hyperlipidemia, unspecified: Secondary | ICD-10-CM | POA: Diagnosis not present

## 2017-08-18 DIAGNOSIS — I10 Essential (primary) hypertension: Secondary | ICD-10-CM

## 2017-08-18 DIAGNOSIS — I4819 Other persistent atrial fibrillation: Secondary | ICD-10-CM

## 2017-08-18 DIAGNOSIS — I481 Persistent atrial fibrillation: Secondary | ICD-10-CM | POA: Diagnosis not present

## 2017-08-18 DIAGNOSIS — I255 Ischemic cardiomyopathy: Secondary | ICD-10-CM | POA: Diagnosis not present

## 2017-08-18 LAB — POCT INR: INR: 3.8

## 2017-08-18 NOTE — Patient Instructions (Signed)
Lab work next time you have coumadin check  ( lipid,hepatic panels ) Nothing to eat or drink after midnight    Your physician wants you to follow-up in: 6 months. You will receive a reminder letter in the mail two months in advance. If you don't receive a letter, please call our office to schedule the follow-up appointment.

## 2017-09-01 ENCOUNTER — Other Ambulatory Visit: Payer: Medicare Other

## 2017-09-01 ENCOUNTER — Ambulatory Visit (INDEPENDENT_AMBULATORY_CARE_PROVIDER_SITE_OTHER): Payer: Medicare Other | Admitting: *Deleted

## 2017-09-01 DIAGNOSIS — Z5181 Encounter for therapeutic drug level monitoring: Secondary | ICD-10-CM | POA: Diagnosis not present

## 2017-09-01 DIAGNOSIS — I2510 Atherosclerotic heart disease of native coronary artery without angina pectoris: Secondary | ICD-10-CM

## 2017-09-01 DIAGNOSIS — Z7901 Long term (current) use of anticoagulants: Secondary | ICD-10-CM | POA: Diagnosis not present

## 2017-09-01 DIAGNOSIS — I255 Ischemic cardiomyopathy: Secondary | ICD-10-CM

## 2017-09-01 DIAGNOSIS — I481 Persistent atrial fibrillation: Secondary | ICD-10-CM | POA: Diagnosis not present

## 2017-09-01 DIAGNOSIS — I4819 Other persistent atrial fibrillation: Secondary | ICD-10-CM

## 2017-09-01 DIAGNOSIS — I10 Essential (primary) hypertension: Secondary | ICD-10-CM

## 2017-09-01 DIAGNOSIS — E785 Hyperlipidemia, unspecified: Secondary | ICD-10-CM

## 2017-09-01 LAB — HEPATIC FUNCTION PANEL
ALK PHOS: 55 IU/L (ref 39–117)
ALT: 18 IU/L (ref 0–44)
AST: 26 IU/L (ref 0–40)
Albumin: 4.5 g/dL (ref 3.5–4.8)
Bilirubin Total: 0.4 mg/dL (ref 0.0–1.2)
Bilirubin, Direct: 0.11 mg/dL (ref 0.00–0.40)
Total Protein: 6.9 g/dL (ref 6.0–8.5)

## 2017-09-01 LAB — LIPID PANEL
CHOL/HDL RATIO: 2.6 ratio (ref 0.0–5.0)
CHOLESTEROL TOTAL: 137 mg/dL (ref 100–199)
HDL: 53 mg/dL (ref >39)
LDL Calculated: 57 mg/dL (ref 0–99)
TRIGLYCERIDES: 135 mg/dL (ref 0–149)
VLDL Cholesterol Cal: 27 mg/dL (ref 5–40)

## 2017-09-01 LAB — POCT INR: INR: 2.9

## 2017-09-15 ENCOUNTER — Ambulatory Visit (INDEPENDENT_AMBULATORY_CARE_PROVIDER_SITE_OTHER): Payer: Medicare Other | Admitting: Pharmacist

## 2017-09-15 DIAGNOSIS — I481 Persistent atrial fibrillation: Secondary | ICD-10-CM

## 2017-09-15 DIAGNOSIS — Z7901 Long term (current) use of anticoagulants: Secondary | ICD-10-CM

## 2017-09-15 DIAGNOSIS — I4819 Other persistent atrial fibrillation: Secondary | ICD-10-CM

## 2017-09-15 LAB — POCT INR: INR: 2.9

## 2017-09-15 MED ORDER — WARFARIN SODIUM 5 MG PO TABS: ORAL_TABLET | ORAL | 2 refills | Status: DC

## 2017-10-13 ENCOUNTER — Ambulatory Visit (INDEPENDENT_AMBULATORY_CARE_PROVIDER_SITE_OTHER): Payer: Medicare Other | Admitting: Pharmacist

## 2017-10-13 DIAGNOSIS — I4819 Other persistent atrial fibrillation: Secondary | ICD-10-CM

## 2017-10-13 DIAGNOSIS — Z7901 Long term (current) use of anticoagulants: Secondary | ICD-10-CM

## 2017-10-13 DIAGNOSIS — I481 Persistent atrial fibrillation: Secondary | ICD-10-CM | POA: Diagnosis not present

## 2017-10-13 LAB — POCT INR: INR: 4.1

## 2017-10-13 NOTE — Patient Instructions (Signed)
Hold warfarin tonight then continue taking 1.5 tablets daily except 2 tablets on Mondays, Wednesdays, and Fridays. Eat a least 1 serving of greens per week. Recheck in 2 weeks. Coumadin Clinic [phone]

## 2017-10-27 ENCOUNTER — Ambulatory Visit (INDEPENDENT_AMBULATORY_CARE_PROVIDER_SITE_OTHER): Payer: Medicare Other | Admitting: Pharmacist

## 2017-10-27 DIAGNOSIS — I481 Persistent atrial fibrillation: Secondary | ICD-10-CM

## 2017-10-27 DIAGNOSIS — Z7901 Long term (current) use of anticoagulants: Secondary | ICD-10-CM

## 2017-10-27 DIAGNOSIS — I4819 Other persistent atrial fibrillation: Secondary | ICD-10-CM

## 2017-10-27 LAB — POCT INR: INR: 2.6

## 2017-10-27 NOTE — Patient Instructions (Signed)
Continue taking 1.5 tablets daily except 2 tablets on Mondays, Wednesdays, and Fridays. Recheck in 3 weeks. Coumadin Clinic [phone]

## 2017-11-27 ENCOUNTER — Ambulatory Visit (INDEPENDENT_AMBULATORY_CARE_PROVIDER_SITE_OTHER): Payer: Medicare Other | Admitting: *Deleted

## 2017-11-27 DIAGNOSIS — Z7901 Long term (current) use of anticoagulants: Secondary | ICD-10-CM

## 2017-11-27 DIAGNOSIS — I481 Persistent atrial fibrillation: Secondary | ICD-10-CM

## 2017-11-27 DIAGNOSIS — I4819 Other persistent atrial fibrillation: Secondary | ICD-10-CM

## 2017-11-27 LAB — POCT INR: INR: 1.9

## 2017-11-27 NOTE — Patient Instructions (Signed)
Description   Today take 2.5 tablets then continue taking 1.5 tablets daily except 2 tablets on Mondays, Wednesdays, and Fridays. Recheck in 3 weeks. Coumadin Clinic [phone]

## 2017-12-17 ENCOUNTER — Other Ambulatory Visit: Payer: Self-pay | Admitting: Cardiology

## 2017-12-18 ENCOUNTER — Ambulatory Visit (INDEPENDENT_AMBULATORY_CARE_PROVIDER_SITE_OTHER): Payer: Medicare Other | Admitting: Pharmacist

## 2017-12-18 DIAGNOSIS — Z7901 Long term (current) use of anticoagulants: Secondary | ICD-10-CM | POA: Diagnosis not present

## 2017-12-18 DIAGNOSIS — I481 Persistent atrial fibrillation: Secondary | ICD-10-CM | POA: Diagnosis not present

## 2017-12-18 DIAGNOSIS — I4819 Other persistent atrial fibrillation: Secondary | ICD-10-CM

## 2017-12-18 LAB — POCT INR: INR: 2.1

## 2017-12-18 NOTE — Telephone Encounter (Signed)
Called patient. He states he stopped taking Coreg because he thought it was making him feel swimmy headed, He said that was not causing it so he started back taking and now needs a refill. He states Dr. Sol Passer and Dr. Mayford Knife stated he needs to be on coreg. He states Dr. Sol Passer will not refill because next month he will no longer be a patient of Dr. Sol Passer. Patient states he is taking Coreg 3.125 mg two times a day, and has a follow up appt scheduled with Dr. Mayford Knife on 02/05/18. Reviewed with Dr. Mayford Knife. Per Dr. Mayford Knife okay to refill. Prescription sent to pharmacy.

## 2017-12-18 NOTE — Telephone Encounter (Signed)
Carvedilol is not listed on patients current med list, per last office visit the patient did not want to get swimmy headed so he stopped it. Spoke with the patient and he states that Dr Mayford Knife wanted him to take it, so he has been. I confirmed dose with him and he is taking 3.125 mg bid and only has two pills remaining. Okay to refill? Please advise. Thanks, MI

## 2017-12-18 NOTE — Patient Instructions (Signed)
Description   Continue taking 1.5 tablets daily except 2 tablets on Mondays, Wednesdays, and Fridays. Recheck in 4 weeks. Coumadin Clinic [phone]

## 2018-01-15 ENCOUNTER — Ambulatory Visit (INDEPENDENT_AMBULATORY_CARE_PROVIDER_SITE_OTHER): Payer: Medicare Other

## 2018-01-15 DIAGNOSIS — I481 Persistent atrial fibrillation: Secondary | ICD-10-CM

## 2018-01-15 DIAGNOSIS — Z7901 Long term (current) use of anticoagulants: Secondary | ICD-10-CM

## 2018-01-15 DIAGNOSIS — I4819 Other persistent atrial fibrillation: Secondary | ICD-10-CM

## 2018-01-15 LAB — POCT INR: INR: 2.9

## 2018-01-15 NOTE — Patient Instructions (Signed)
Description   Continue on same dosage 1.5 tablets daily except 2 tablets on Mondays, Wednesdays, and Fridays. Recheck in 3 weeks. Coumadin Clinic [phone]

## 2018-01-31 ENCOUNTER — Other Ambulatory Visit: Payer: Self-pay | Admitting: Cardiology

## 2018-02-05 ENCOUNTER — Ambulatory Visit (INDEPENDENT_AMBULATORY_CARE_PROVIDER_SITE_OTHER): Payer: Medicare Other | Admitting: Cardiology

## 2018-02-05 ENCOUNTER — Ambulatory Visit (INDEPENDENT_AMBULATORY_CARE_PROVIDER_SITE_OTHER): Payer: Medicare Other | Admitting: Pharmacist

## 2018-02-05 ENCOUNTER — Encounter: Payer: Self-pay | Admitting: Cardiology

## 2018-02-05 VITALS — BP 128/67 | HR 66 | Ht 67.0 in | Wt 194.4 lb

## 2018-02-05 DIAGNOSIS — I10 Essential (primary) hypertension: Secondary | ICD-10-CM | POA: Diagnosis not present

## 2018-02-05 DIAGNOSIS — I420 Dilated cardiomyopathy: Secondary | ICD-10-CM

## 2018-02-05 DIAGNOSIS — I2510 Atherosclerotic heart disease of native coronary artery without angina pectoris: Secondary | ICD-10-CM

## 2018-02-05 DIAGNOSIS — I4819 Other persistent atrial fibrillation: Secondary | ICD-10-CM

## 2018-02-05 DIAGNOSIS — I481 Persistent atrial fibrillation: Secondary | ICD-10-CM

## 2018-02-05 DIAGNOSIS — E785 Hyperlipidemia, unspecified: Secondary | ICD-10-CM

## 2018-02-05 DIAGNOSIS — Z7901 Long term (current) use of anticoagulants: Secondary | ICD-10-CM | POA: Diagnosis not present

## 2018-02-05 LAB — POCT INR: INR: 2.7

## 2018-02-05 NOTE — Patient Instructions (Signed)
Description   Continue on same dosage 1.5 tablets daily except 2 tablets on Mondays, Wednesdays, and Fridays. Pt is going to start going to Dr Molly Maduro Dough's office in [place] to have his INR managed and monitored. Due for next INR in 4-6 weeks.

## 2018-02-05 NOTE — Patient Instructions (Signed)
Medication Instructions:  Your physician recommends that you continue on your current medications as directed. Please refer to the Current Medication list given to you today.  If you need a refill on your cardiac medications, please contact your pharmacy first.  Labwork: Today for kidney function   Testing/Procedures: None ordered   Follow-Up: Your physician wants you to follow-up in: 6 months with PA. You will receive a reminder letter in the mail two months in advance. If you don't receive a letter, please call our office to schedule the follow-up appointment.  Your physician wants you to follow-up in: 1 year with Dr. Mayford Knife. You will receive a reminder letter in the mail two months in advance. If you don't receive a letter, please call our office to schedule the follow-up appointment.  Any Other Special Instructions Will Be Listed Below (If Applicable).   Thank you for choosing [facility]    Lyda Perone, RN  [phone]  If you need a refill on your cardiac medications before your next appointment, please call your pharmacy.

## 2018-02-05 NOTE — Progress Notes (Signed)
Cardiology Office Note:    Date:  02/05/2018   ID:  Scott Levine, Scott Levine [birth year], MRN [id number]  PCP:  Olive Bass, MD  Cardiologist:  No primary care provider on file.    Referring MD: Olive Bass, MD   Chief Complaint  Patient presents with  . Coronary Artery Disease  . Hypertension  . Hyperlipidemia  . Atrial Fibrillation    History of Present Illness:    Scott Levine is a 77 y.o. male with a hx of persistent atrial fibrillation s/p DCCV to NSR 07/05/2015, HTN, Type II DM, dyslipidemia, CVA and DCM with EF 35-40%.  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 He is here today for followup and is doing well.  He denies any chest pain or pressure, SOB, DOE, PND, orthopnea, LE edema, palpitations or syncope. He is compliant with his meds and is tolerating meds with no SE.      Past Medical History:  Diagnosis Date  . Anemia   . Arthritis    "all over"  . CAD (coronary artery disease), native coronary artery    Cath 10/2015 with 90% RCA s/p DES stent with residual 40% LCx and luminal irregularities elsewhere.  . Gout   . HTN (hypertension)   . Hyperlipidemia    LDL goal < 70  . Hypogonadism male   . Ischemic dilated cardiomyopathy (HCC)    EF 50-55% by echo 2017  . Kidney stones 1960's   "passed them"  . Leg muscle spasm   . Migraine    "when I was young"  . PAF (paroxysmal atrial fibrillation) (HCC)    CHADS2VASC score is 6 on Xarelto  . Pneumonia X 1-2  . Stroke (HCC) ~ 05/12/2006; 05/17/2006; 07/18/2006   "small; big; small; can't walk; weaker on my right since" (10/03/2015)  . Type II diabetes mellitus (HCC)     Past Surgical History:  Procedure Laterality Date  . APPENDECTOMY    . CARDIAC CATHETERIZATION N/A 10/03/2015   Procedure: Left  Heart Cath and Coronary Angiography;  Surgeon: Corky Crafts, MD;  Location: [facility] INVASIVE CV LAB;  Service: Cardiovascular;  Laterality: N/A;  . CARDIAC CATHETERIZATION N/A 10/03/2015   Procedure: Coronary Stent Intervention;  Surgeon: Corky Crafts, MD;  Location: [facility] INVASIVE CV LAB;  Service: Cardiovascular;  Laterality: N/A;  . CARDIOVERSION N/A 07/05/2015   Procedure: CARDIOVERSION;  Surgeon: Wendall Stade, MD;  Location: [facility] ENDOSCOPY;  Service: Cardiovascular;  Laterality: N/A;  . CARPAL TUNNEL RELEASE Left 1988  . SOFT TISSUE MASS EXCISION  1991   GROWTH ON CHEST TAKEN OFF; IT WOULD SWELL AND GET SORE; DR. SAID IT WASN'T CANCER"  . TONSILLECTOMY      Current Medications: Current Meds  Medication Sig  . carvedilol (COREG) 3.125 MG tablet Take 1 tablet (3.125 mg total) by mouth 2 (two) times daily with a meal. TAKE 1 TABLET (3.125 MG TOTAL) BY MOUTH 2 (TWO) TIMES DAILY.  Marland Kitchen clopidogrel (PLAVIX) 75 MG tablet TAKE 1 TABLET BY MOUTH EVERY DAY  . glimepiride (AMARYL) 4 MG tablet Take 4 mg by mouth daily with breakfast.  . JARDIANCE 10 MG TABS tablet TAKE 1 TABLET BY MOUTH EVERY DAY DIABETES  . lisinopril (PRINIVIL,ZESTRIL) 30 MG tablet TAKE 1 TABLET BY MOUTH EVERY MORNING FOR  HIGH BLOOD PRESSURE  . metFORMIN (GLUCOPHAGE) 1000 MG tablet Take 1 tablet (1,000 mg total) by mouth 2 (two) times daily with a meal.  . nitroGLYCERIN (NITROSTAT) 0.4 MG SL tablet Place 1 tablet (0.4 mg total) under the tongue every 5 (five) minutes as needed for chest pain.  . simvastatin (ZOCOR) 20 MG tablet Take 20 mg by mouth daily.  Marland Kitchen warfarin (COUMADIN) 5 MG tablet Take 1.5 to 2 tablets daily as directed by [facility]madin Clinic     Allergies:   Penicillins   Social History   Socioeconomic History  . Marital status: Divorced    Spouse name: None  . Number of children: 3  . Years of education: 21  . Highest education level: None  Social Needs  . Financial resource strain: None  . Food insecurity -  worry: None  . Food insecurity - inability: None  . Transportation needs - medical: None  . Transportation needs - non-medical: None  Occupational History  . Occupation: retired  Tobacco Use  . Smoking status: Former Smoker    Packs/day: 1.00    Years: 4.00    Pack years: 4.00    Types: Cigarettes, Cigars    Last attempt to quit: 06/26/1964    Years since quitting: 53.6  . Smokeless tobacco: Former Neurosurgeon    Quit date: 12/01/1965  Substance and Sexual Activity  . Alcohol use: Yes    Alcohol/week: 0.0 oz    Comment: 10/03/2015 "nothing since  the early 2000's"  . Drug use: No  . Sexual activity: Yes  Other Topics Concern  . None  Social History Narrative  . None     Family History: The patient's family history includes Breast cancer in his mother and sister; Diabetes in his maternal grandfather, maternal grandmother, mother, and son; Healthy in his brother and brother; Heart attack in his father; Liver cancer in his brother; Other in his daughter and son.  ROS:   Please see the history of present illness.    ROS  All other systems reviewed and negative.   EKGs/Labs/Other Studies Reviewed:    The following studies were reviewed today: none  EKG:  EKG is not ordered today.    Recent Labs: 09/01/2017: ALT 18   Recent Lipid Panel    Component Value Date/Time   CHOL 137 09/01/2017 0937   TRIG 135 09/01/2017 0937   HDL 53 09/01/2017 0937   CHOLHDL 2.6 09/01/2017 0937   LDLCALC 57 09/01/2017 0937    Physical Exam:    VS:  BP 128/67   Pulse 66   Ht 5\' 7"  (1.702 m)   Wt 194 lb 6.4 oz (88.2 kg)   BMI 30.45 kg/m     Wt Readings from Last 3 Encounters:  02/05/18 194 lb 6.4 oz (88.2 kg)  08/18/17 195 lb (88.5 kg)  01/01/17 196 lb 12.8 oz (89.3 kg)     GEN:  Well nourished, well developed in no acute distress HEENT: Normal NECK: No JVD; No carotid bruits LYMPHATICS: No lymphadenopathy CARDIAC: RRR, no murmurs, rubs, gallops RESPIRATORY:  Clear to auscultation  without rales, wheezing or rhonchi  ABDOMEN: Soft, non-tender, non-distended MUSCULOSKELETAL:  No edema; No deformity  SKIN: Warm and dry NEUROLOGIC:  Alert and oriented x 3 PSYCHIATRIC:  Normal affect   ASSESSMENT:    1. Persistent atrial fibrillation (HCC)   2. Benign essential HTN   3. Dilated cardiomyopathy (HCC)   4. Coronary artery disease involving native coronary artery of native heart without  angina pectoris   5. Dyslipidemia    PLAN:    In order of problems listed above:  1.  Persistent atrial fibrillation - s/p DCCV to NSR 07/05/2015.  He remains in normal sinus rhythm on exam today.  He will continue on warfarin and carvedilol 3.125 mg twice daily.  He denies any excessive bleeding or falls.  2.  HTN -blood pressure is well controlled on exam today.  He will continue on warfarin 3.125 mg twice daily and lisinopril 30 mg daily.  3.  Ischemic DCM -EF 50-55% on echo 08/22/2016.  He will continue on beta-blocker and ACE inhibitor.  His creatinine was 0.94 on 01/01/2017.  I will repeat a BMETtoday.  4.  ASCAD - single vessel obstructive coronary artery disease in a very large RCA, with 90% stenosis in the mid portion, successfully treated with a drug-eluting stent 10/2015 with residual 40% LCx and luminal irregularirites elsewhere.  He has not had any anginal symptoms.  He will continue on Plavix 75 mg daily, carvedilol 3.125 mg twice daily and statin.  He is not on aspirin due to warfarin.  5.  Hyperlipidemia with LDL goal < 70.  He will continue on simvastatin 20 mg daily.  His LDL was 57 on 09/01/2017.  Medication Adjustments/Labs and Tests Ordered: Current medicines are reviewed at length with the patient today.  Concerns regarding medicines are outlined above.  No orders of the defined types were placed in this encounter.  No orders of the defined types were placed in this encounter.   Signed, Armanda Magic, MD  02/05/2018 1:43 PM    [facility]

## 2018-02-06 LAB — BASIC METABOLIC PANEL
BUN/Creatinine Ratio: 15 (ref 10–24)
BUN: 15 mg/dL (ref 8–27)
CALCIUM: 9.7 mg/dL (ref 8.6–10.2)
CO2: 22 mmol/L (ref 20–29)
Chloride: 100 mmol/L (ref 96–106)
Creatinine, Ser: 0.97 mg/dL (ref 0.76–1.27)
GFR, EST AFRICAN AMERICAN: 86 mL/min/{1.73_m2} (ref >59)
GFR, EST NON AFRICAN AMERICAN: 74 mL/min/{1.73_m2} (ref >59)
Glucose: 163 mg/dL — ABNORMAL HIGH (ref 65–99)
Potassium: 4.9 mmol/L (ref 3.5–5.2)
Sodium: 140 mmol/L (ref 134–144)

## 2018-02-08 ENCOUNTER — Telehealth: Payer: Self-pay

## 2018-02-08 NOTE — Telephone Encounter (Signed)
Pt is aware and agreeable to stable labs.  

## 2018-02-08 NOTE — Telephone Encounter (Signed)
-----   Message from Phineas Semen, RN sent at 02/08/2018 12:07 PM EDT ----- To CMA pool

## 2018-02-22 ENCOUNTER — Other Ambulatory Visit: Payer: Self-pay | Admitting: Cardiology

## 2018-02-22 NOTE — Telephone Encounter (Signed)
Warfarin refill sent to [facility]; per last Anticoagulation record pt is being followed by [facility]. The RX will have to sent to the correct MD as pt is no longer followed by our facility.

## 2018-03-01 ENCOUNTER — Other Ambulatory Visit: Payer: Self-pay | Admitting: *Deleted

## 2018-03-20 ENCOUNTER — Other Ambulatory Visit: Payer: Self-pay | Admitting: Cardiology

## 2018-03-26 ENCOUNTER — Other Ambulatory Visit: Payer: Self-pay | Admitting: Cardiology

## 2018-03-29 ENCOUNTER — Other Ambulatory Visit: Payer: Self-pay | Admitting: Cardiology

## 2018-03-31 ENCOUNTER — Ambulatory Visit (INDEPENDENT_AMBULATORY_CARE_PROVIDER_SITE_OTHER): Payer: Medicare Other | Admitting: *Deleted

## 2018-03-31 DIAGNOSIS — Z5181 Encounter for therapeutic drug level monitoring: Secondary | ICD-10-CM

## 2018-03-31 DIAGNOSIS — I481 Persistent atrial fibrillation: Secondary | ICD-10-CM | POA: Diagnosis not present

## 2018-03-31 DIAGNOSIS — Z7901 Long term (current) use of anticoagulants: Secondary | ICD-10-CM | POA: Diagnosis not present

## 2018-03-31 DIAGNOSIS — I4819 Other persistent atrial fibrillation: Secondary | ICD-10-CM

## 2018-03-31 LAB — POCT INR: INR: 2.2

## 2018-03-31 MED ORDER — WARFARIN SODIUM 5 MG PO TABS: ORAL_TABLET | ORAL | 2 refills | Status: DC

## 2018-03-31 NOTE — Patient Instructions (Signed)
Description   Continue on same dosage 1.5 tablets daily except 2 tablets on Mondays, Wednesdays, and Fridays.Recheck INR in 4 weeks

## 2018-04-07 ENCOUNTER — Other Ambulatory Visit: Payer: Self-pay | Admitting: *Deleted

## 2018-04-07 ENCOUNTER — Telehealth: Payer: Self-pay | Admitting: *Deleted

## 2018-04-07 NOTE — Telephone Encounter (Signed)
Spoke with pt and he states that he wants his medication refills sent in to [facility] so [facility] removed from list of desired pharmacies

## 2018-05-03 ENCOUNTER — Ambulatory Visit (INDEPENDENT_AMBULATORY_CARE_PROVIDER_SITE_OTHER): Payer: Medicare Other | Admitting: *Deleted

## 2018-05-03 DIAGNOSIS — I481 Persistent atrial fibrillation: Secondary | ICD-10-CM | POA: Diagnosis not present

## 2018-05-03 DIAGNOSIS — I4819 Other persistent atrial fibrillation: Secondary | ICD-10-CM

## 2018-05-03 DIAGNOSIS — Z5181 Encounter for therapeutic drug level monitoring: Secondary | ICD-10-CM | POA: Diagnosis not present

## 2018-05-03 DIAGNOSIS — Z7901 Long term (current) use of anticoagulants: Secondary | ICD-10-CM | POA: Diagnosis not present

## 2018-05-03 LAB — POCT INR: INR: 2.1 (ref 2.0–3.0)

## 2018-05-03 NOTE — Patient Instructions (Signed)
Description   Continue on same dosage 1.5 tablets daily except 2 tablets on Mondays, Wednesdays, and Fridays.Recheck INR in 4 weeks     

## 2018-06-21 ENCOUNTER — Other Ambulatory Visit: Payer: Self-pay

## 2018-06-21 MED ORDER — CARVEDILOL 3.125 MG PO TABS: 3.1250 mg | ORAL_TABLET | Freq: Two times a day (BID) | ORAL | 0 refills | Status: DC

## 2018-07-26 ENCOUNTER — Other Ambulatory Visit: Payer: Self-pay | Admitting: *Deleted

## 2018-07-26 ENCOUNTER — Other Ambulatory Visit: Payer: Self-pay | Admitting: Cardiology

## 2018-07-26 MED ORDER — CARVEDILOL 3.125 MG PO TABS: 3.1250 mg | ORAL_TABLET | Freq: Two times a day (BID) | ORAL | 2 refills | Status: AC

## 2018-07-26 NOTE — Telephone Encounter (Signed)
Called spoke with pt, pt cancelled appt 05/31/18, last seen 05/03/18.  Pt states he is now having his Coumadin monitored and managed by his primary MD Dr Sol Passer.  Advised pt to notify pharmacy to sent refill request to his primary MD to refill since they are now monitoring and managing his Warfarin.

## 2018-07-30 ENCOUNTER — Telehealth: Payer: Self-pay | Admitting: Cardiology

## 2018-07-30 NOTE — Telephone Encounter (Signed)
New Message      Huston Foley with [facility] is calling to speak with you about the patient's blood thinner meds. She would like a call back.

## 2018-07-30 NOTE — Telephone Encounter (Signed)
This pt does not follow at our [facility]madin Clinic anymore.  He now follows and has his Coumadin monitored and managed by primary MD Dr Sol Passer in [place].

## 2018-07-30 NOTE — Telephone Encounter (Signed)
PER LAST CC APPT PT WAS TO TAKE WARFARIN 10 MG Monday,WEDNESDAY, AND Friday AND REMAINING DAYS TAKE 7.5 MG . WILL FORWARD MESSAGE TO CC AS PT HAS NOT BEEN IN SINCE 05/03/18 AND IS OVERDUE FOR APPT .Zack Seal

## 2019-03-03 ENCOUNTER — Telehealth: Payer: Self-pay | Admitting: Cardiology

## 2019-03-03 NOTE — Telephone Encounter (Signed)
New Message   Pt is calling about his upcoming appt and is wondering if he needs to make it a virtual visit or if he can cancel it and schedule for a later time   Please call

## 2019-03-04 NOTE — Telephone Encounter (Signed)
   TELEPHONE CALL NOTE  This patient has been deemed a candidate for follow-up tele-health visit to limit community exposure during the Covid-19 pandemic. I spoke with the patient via phone to discuss instructions. This has been outlined on the patient's AVS (dotphrase: hcevisitinfo). The patient was advised to review the section on consent for treatment as well. The patient will receive a phone call 2-3 days prior to their E-Visit at which time consent will be verbally confirmed. A Virtual Office Visit appointment type has been scheduled for 8:40 am on 03/17/19 with Dr. Mayford Knife, with "VIDEO" or "TELEPHONE" in the appointment notes - patient prefers type.  I have either confirmed the patient is active in MyChart or offered to send sign-up link to phone/email via Mychart icon beside patient's photo.   Dustin Flock, RN 03/04/2019 10:18 AM

## 2019-03-15 ENCOUNTER — Telehealth: Payer: Self-pay | Admitting: Cardiology

## 2019-03-15 NOTE — Telephone Encounter (Signed)
Telephone only/verbal consent 03/15/19

## 2019-03-16 NOTE — Progress Notes (Signed)
Virtual Visit via Telephone Note   This visit type was conducted due to national recommendations for restrictions regarding the COVID-19 Pandemic (e.g. social distancing) in an effort to limit this patient's exposure and mitigate transmission in our community.  Due to his co-morbid illnesses, this patient is at least at moderate risk for complications without adequate follow up.  This format is felt to be most appropriate for this patient at this time.  The patient did not have access to video technology/had technical difficulties with video requiring transitioning to audio format only (telephone).  All issues noted in this document were discussed and addressed.  No physical exam could be performed with this format.  Please refer to the patient's chart for his  consent to telehealth for [facility].  Evaluation Performed:  Follow-up visit  This visit type was conducted due to national recommendations for restrictions regarding the COVID-19 Pandemic (e.g. social distancing).  This format is felt to be most appropriate for this patient at this time.  All issues noted in this document were discussed and addressed.  No physical exam was performed (except for noted visual exam findings with Video Visits).  Please refer to the patient's chart (MyChart message for video visits and phone note for telephone visits) for the patient's consent to telehealth for [facility].  Date:  03/17/2019   ID:  Scott CarwinGene F Alvillar, DOB 08-Jun-1941, MRN [id number]  Patient Location:  Home  Provider location[place]   [place]  PCP:  Olive Bassough, Robert L, MD  Cardiologist:  Armanda Magicraci Turner, MD Electrophysiologist:  None   Chief Complaint:  ASCAD, HTN, hyperlipidemia and Atrial fibrillation  History of Present Illness:    Scott Levine is a 78 y.o. male who presents via audio/video conferencing for a telehealth visit today.    Bacilio F Scott Levine is a 77y.o. male with a hx of persistent atrial fibrillations/p DCCV to NSR  07/05/2015, HTN, Type II DM, dyslipidemia, CVA and DCM with EF 35-40%. He also has a history ofASCAD with single vessel obstructive coronary artery disease in a very large RCA, with 90% stenosis in the mid portion, successfully treated with a drug-eluting stent11/2016 with residual40% LCx and luminal irregularirites elsewhere.  He is here today for followup and is doing well.  He denies any chest pain or pressure, SOB, DOE, PND, orthopnea, palpitations or syncope. Occasionally he will get dizzy if he stands up to quickly.  He has some LE edema a few weeks ago but that resolved.  He is compliant with his meds and is tolerating meds with no SE.    The patient does not have symptoms concerning for COVID-19 infection (fever, chills, cough, or new shortness of breath).   Prior CV studies:   The following studies were reviewed today:  2D echo 08/2016  Past Medical History:  Diagnosis Date  . Anemia   . Arthritis    "all over"  . CAD (coronary artery disease), native coronary artery    Cath 10/2015 with 90% RCA s/p DES stent with residual 40% LCx and luminal irregularities elsewhere.  . Gout   . HTN (hypertension)   . Hyperlipidemia    LDL goal < 70  . Hypogonadism male   . Ischemic dilated cardiomyopathy (HCC)    EF 50-55% by echo 2017  . Kidney stones 1960's   "passed them"  . Leg muscle spasm   . Migraine    "when I was young"  . PAF (paroxysmal atrial fibrillation) (HCC)    CHADS2VASC score  is 6 on Xarelto  . Pneumonia X 1-2  . Stroke (HCC) ~ 05/12/2006; 05/17/2006; 07/18/2006   "small; big; small; can't walk; weaker on my right since" (10/03/2015)  . Type II diabetes mellitus (HCC)    Past Surgical History:  Procedure Laterality Date  . APPENDECTOMY    . CARDIAC CATHETERIZATION N/A 10/03/2015   Procedure: Left Heart Cath and Coronary Angiography;  Surgeon: Corky Crafts, MD;  Location: [facility] INVASIVE CV LAB;  Service: Cardiovascular;  Laterality: N/A;  . CARDIAC CATHETERIZATION  N/A 10/03/2015   Procedure: Coronary Stent Intervention;  Surgeon: Corky Crafts, MD;  Location: [facility] INVASIVE CV LAB;  Service: Cardiovascular;  Laterality: N/A;  . CARDIOVERSION N/A 07/05/2015   Procedure: CARDIOVERSION;  Surgeon: Wendall Stade, MD;  Location: [facility] ENDOSCOPY;  Service: Cardiovascular;  Laterality: N/A;  . CARPAL TUNNEL RELEASE Left 1988  . SOFT TISSUE MASS EXCISION  1991   GROWTH ON CHEST TAKEN OFF; IT WOULD SWELL AND GET SORE; DR. SAID IT WASN'T CANCER"  . TONSILLECTOMY       Current Meds  Medication Sig  . atorvastatin (LIPITOR) 40 MG tablet Take 1 tablet (40 mg total) by mouth daily. cholesterol  . carvedilol (COREG) 3.125 MG tablet Take 1 tablet (3.125 mg total) by mouth 2 (two) times daily with a meal.  . clopidogrel (PLAVIX) 75 MG tablet Take 1 tablet (75 mg total) by mouth daily. Heart attack prevention  . Dulaglutide 1.5 MG/0.5ML SOPN Inject 1.5 mg into the skin every 7 days.  . empagliflozin (JARDIANCE) 25 MG TABS tablet Take 25 mg by mouth daily.  Marland Kitchen glimepiride (AMARYL) 4 MG tablet Take 1 tablet (4 mg total) by mouth daily before breakfast. diabetes  . lisinopril (PRINIVIL,ZESTRIL) 30 MG tablet TAKE 1 TABLET BY MOUTH EVERY MORNING FOR HIGH BLOOD PRESSURE  . metFORMIN (GLUCOPHAGE) 1000 MG tablet Take 1 tablet (1,000 mg total) by mouth 2 (two) times daily with a meal.  . nitroGLYCERIN (NITROSTAT) 0.4 MG SL tablet Place 1 tablet (0.4 mg total) under the tongue every 5 (five) minutes as needed for chest pain.  . TRULICITY 1.5 MG/0.5ML SOPN Inject 1 Dose into the skin every Wednesday.  . warfarin (COUMADIN) 5 MG tablet Take as directed by coumadin clinic     Allergies:   Penicillins   Social History   Tobacco Use  . Smoking status: Former Smoker    Packs/day: 1.00    Years: 4.00    Pack years: 4.00    Types: Cigarettes, Cigars    Last attempt to quit: 06/26/1964    Years since quitting: 54.7  . Smokeless tobacco: Former Neurosurgeon    Quit date: 12/01/1965   Substance Use Topics  . Alcohol use: Yes    Alcohol/week: 0.0 standard drinks    Comment: 10/03/2015 "nothing since  the early 2000's"  . Drug use: No     Family Hx: The patient's family history includes Breast cancer in his mother and sister; Diabetes in his maternal grandfather, maternal grandmother, mother, and son; Healthy in his brother and brother; Heart attack in his father; Liver cancer in his brother; Other in his daughter and son.  ROS:   Please see the history of present illness.     All other systems reviewed and are negative.   Labs/Other Tests and Data Reviewed:    Recent Labs: No results found for requested labs within last 8760 hours.   Recent Lipid Panel Lab Results  Component Value Date/Time   CHOL 137  09/01/2017 09:37 AM   TRIG 135 09/01/2017 09:37 AM   HDL 53 09/01/2017 09:37 AM   CHOLHDL 2.6 09/01/2017 09:37 AM   LDLCALC 57 09/01/2017 09:37 AM    Wt Readings from Last 3 Encounters:  03/17/19 191 lb (86.6 kg)  02/05/18 194 lb 6.4 oz (88.2 kg)  08/18/17 195 lb (88.5 kg)     Objective:    Vital Signs:  Wt 191 lb (86.6 kg)   BMI 29.91 kg/m    Unable to perform PE due to telephone visit as patient    ASSESSMENT & PLAN:    1.  Persistent atrial fibrillation - he is s/p DCCV to NSR 07/2015 and has maintained NSR since then.  He does not think that he has been in afib any since I saw him last.  He will continue on warfarin and Carvedilol 3.125mg  BID.  He denies any problems with bleeding or falls.    2.  HTN - he checks his BP at home and it has been controlled. He will continue on carvedilol 3.125mg  BID and Lisinopril 30mg  daily.  I will check a BMET in July as well.   3.  Ischemic DCM - his 2D echo 2017 showed low normal LVF with EF 50-55% and G1DD.  Continue BB and ACE I.   4.  ASCAD -  Cath 2016 showed single vessel obstructive coronary artery disease in a very large RCA, with 90% stenosis in the mid portion, successfully treated with a  drug-eluting stent11/2016 with residual40% LCx and luminal irregularitis elsewhere.  He has not had any angina since I saw him last.  He will continue on Plavix 75mg  daily, BB and statin.  He is not on ASA due to warfarin and Plavix.  5.  Hyperlipidemia - His LDL goal is < 70.  He has not had lipids done since 2018 so I will set up for FLP and ALT in July due to Covid 19 crisis.  He will continue on simvastatin 20mg  daily.    6.  COVID-19 Education: The signs and symptoms of COVID-19 were discussed with the patient and how to seek care for testing (follow up with PCP or arrange E-visit).  The importance of social distancing was discussed today.  Patient Risk:   After full review of this patient's clinical status, I feel that they are at least moderate risk at this time.  Time:   Today, I have spent 25 minutes directly with the patient on telephone discussing medical problems including afib, HTN, DCM, CAD and high cholesterol as well as discussing healthy diet including low sodium and low carb diet and exercise and reviewing symptoms of COVID 19 and the ways to protect against contracting the virus with telehealth technology and an additional 10 minutes reviewing chart   Medication Adjustments/Labs and Tests Ordered: Current medicines are reviewed at length with the patient today.  Concerns regarding medicines are outlined above.  Tests Ordered: No orders of the defined types were placed in this encounter.  Medication Changes: No orders of the defined types were placed in this encounter.   Disposition:  Follow up in 1 year(s)  Signed, Armanda Magic, MD  03/17/2019 8:43 AM    [facility]

## 2019-03-17 ENCOUNTER — Other Ambulatory Visit: Payer: Self-pay

## 2019-03-17 ENCOUNTER — Encounter: Payer: Self-pay | Admitting: Cardiology

## 2019-03-17 ENCOUNTER — Telehealth (INDEPENDENT_AMBULATORY_CARE_PROVIDER_SITE_OTHER): Payer: Medicare Other | Admitting: Cardiology

## 2019-03-17 VITALS — Wt 191.0 lb

## 2019-03-17 DIAGNOSIS — I2510 Atherosclerotic heart disease of native coronary artery without angina pectoris: Secondary | ICD-10-CM | POA: Diagnosis not present

## 2019-03-17 DIAGNOSIS — I420 Dilated cardiomyopathy: Secondary | ICD-10-CM | POA: Diagnosis not present

## 2019-03-17 DIAGNOSIS — I4819 Other persistent atrial fibrillation: Secondary | ICD-10-CM

## 2019-03-17 DIAGNOSIS — I10 Essential (primary) hypertension: Secondary | ICD-10-CM | POA: Diagnosis not present

## 2019-03-17 DIAGNOSIS — E785 Hyperlipidemia, unspecified: Secondary | ICD-10-CM

## 2019-03-17 NOTE — Telephone Encounter (Signed)
TELEPHONE CALL NOTE  Scott Levine has been deemed a candidate for a follow-up tele-health visit to limit community exposure during the Covid-19 pandemic. I spoke with the patient via phone to ensure availability of phone/video source, confirm preferred email & phone number, and discuss instructions and expectations.  I reminded Scott Levine to be prepared with any vital sign and/or heart rhythm information that could potentially be obtained via home monitoring, at the time of his visit. I reminded Scott Levine to expect a phone call at the time of his visit if his visit.   Consent: YES, by Jasmine December on 03/15/19  Dustin Flock, RN 03/17/2019 8:33 AM  FULL LENGTH CONSENT FOR TELE-HEALTH VISIT   I hereby voluntarily request, consent and authorize [facility] and its employed or contracted physicians, physician assistants, nurse practitioners or other licensed health care professionals (the Practitioner), to provide me with telemedicine health care services (the "Services") as deemed necessary by the treating Practitioner. I acknowledge and consent to receive the Services by the Practitioner via telemedicine. I understand that the telemedicine visit will involve communicating with the Practitioner through live audiovisual communication technology and the disclosure of certain medical information by electronic transmission. I acknowledge that I have been given the opportunity to request an in-person assessment or other available alternative prior to the telemedicine visit and am voluntarily participating in the telemedicine visit.  I understand that I have the right to withhold or withdraw my consent to the use of telemedicine in the course of my care at any time, without affecting my right to future care or treatment, and that the Practitioner or I may terminate the telemedicine visit at any time. I understand that I have the right to inspect all information obtained and/or recorded in the  course of the telemedicine visit and may receive copies of available information for a reasonable fee.  I understand that some of the potential risks of receiving the Services via telemedicine include:  Marland Kitchen Delay or interruption in medical evaluation due to technological equipment failure or disruption; . Information transmitted may not be sufficient (e.g. poor resolution of images) to allow for appropriate medical decision making by the Practitioner; and/or  . In rare instances, security protocols could fail, causing a breach of personal health information.  Furthermore, I acknowledge that it is my responsibility to provide information about my medical history, conditions and care that is complete and accurate to the best of my ability. I acknowledge that Practitioner's advice, recommendations, and/or decision may be based on factors not within their control, such as incomplete or inaccurate data provided by me or distortions of diagnostic images or specimens that may result from electronic transmissions. I understand that the practice of medicine is not an exact science and that Practitioner makes no warranties or guarantees regarding treatment outcomes. I acknowledge that I will receive a copy of this consent concurrently upon execution via email to the email address I last provided but may also request a printed copy by calling the office of [facility].    I understand that my insurance will be billed for this visit.   I have read or had this consent read to me. . I understand the contents of this consent, which adequately explains the benefits and risks of the Services being provided via telemedicine.  . I have been provided ample opportunity to ask questions regarding this consent and the Services and have had my questions answered to my satisfaction. . I give  my informed consent for the services to be provided through the use of telemedicine in my medical care  By participating in this  telemedicine visit I agree to the above.

## 2019-03-17 NOTE — Patient Instructions (Signed)
Medication Instructions:  Your physician recommends that you continue on your current medications as directed. Please refer to the Current Medication list given to you today.  If you need a refill on your cardiac medications before your next appointment, please call your pharmacy.   Lab work: Future: Fasting Labs: BMET, CBC, Lipid and Liver around July, call the office to schedule.  If you have labs (blood work) drawn today and your tests are completely normal, you will receive your results only by: Marland Kitchen MyChart Message (if you have MyChart) OR . A paper copy in the mail If you have any lab test that is abnormal or we need to change your treatment, we will call you to review the results.  Testing/Procedures: None  Follow-Up: At [facility], you and your health needs are our priority.  As part of our continuing mission to provide you with exceptional heart care, we have created designated Provider Care Teams.  These Care Teams include your primary Cardiologist (physician) and Advanced Practice Providers (APPs -  Physician Assistants and Nurse Practitioners) who all work together to provide you with the care you need, when you need it. You will need a follow up appointment in 1 years.  Please call our office 2 months in advance to schedule this appointment.  You may see Dr. Mayford Knife  or one of the following Advanced Practice Providers on your designated Care Team:   [place], PA-C Ronie Spies, PA-C . Jacolyn Reedy, PA-C

## 2019-06-15 ENCOUNTER — Other Ambulatory Visit: Payer: Medicare Other

## 2020-03-19 ENCOUNTER — Ambulatory Visit: Payer: Medicare Other | Admitting: Cardiology

## 2020-03-19 ENCOUNTER — Encounter: Payer: Self-pay | Admitting: Cardiology

## 2020-03-19 ENCOUNTER — Other Ambulatory Visit: Payer: Self-pay

## 2020-03-19 VITALS — BP 114/68 | HR 100 | Ht 67.0 in | Wt 193.0 lb

## 2020-03-19 DIAGNOSIS — E785 Hyperlipidemia, unspecified: Secondary | ICD-10-CM

## 2020-03-19 DIAGNOSIS — I4819 Other persistent atrial fibrillation: Secondary | ICD-10-CM | POA: Diagnosis not present

## 2020-03-19 DIAGNOSIS — I255 Ischemic cardiomyopathy: Secondary | ICD-10-CM | POA: Diagnosis not present

## 2020-03-19 DIAGNOSIS — I10 Essential (primary) hypertension: Secondary | ICD-10-CM | POA: Diagnosis not present

## 2020-03-19 DIAGNOSIS — I2510 Atherosclerotic heart disease of native coronary artery without angina pectoris: Secondary | ICD-10-CM

## 2020-03-19 LAB — COMPREHENSIVE METABOLIC PANEL
ALT: 9 IU/L (ref 0–44)
AST: 14 IU/L (ref 0–40)
Albumin/Globulin Ratio: 1.5 (ref 1.2–2.2)
Albumin: 4 g/dL (ref 3.7–4.7)
Alkaline Phosphatase: 77 IU/L (ref 39–117)
BUN/Creatinine Ratio: 11 (ref 10–24)
BUN: 9 mg/dL (ref 8–27)
Bilirubin Total: 0.5 mg/dL (ref 0.0–1.2)
CO2: 19 mmol/L — ABNORMAL LOW (ref 20–29)
Calcium: 9.3 mg/dL (ref 8.6–10.2)
Chloride: 100 mmol/L (ref 96–106)
Creatinine, Ser: 0.8 mg/dL (ref 0.76–1.27)
GFR calc Af Amer: 98 mL/min/{1.73_m2} (ref >59)
GFR calc non Af Amer: 84 mL/min/{1.73_m2} (ref >59)
Globulin, Total: 2.6 g/dL (ref 1.5–4.5)
Glucose: 173 mg/dL — ABNORMAL HIGH (ref 65–99)
Potassium: 5.3 mmol/L — ABNORMAL HIGH (ref 3.5–5.2)
Sodium: 136 mmol/L (ref 134–144)
Total Protein: 6.6 g/dL (ref 6.0–8.5)

## 2020-03-19 LAB — LIPID PANEL
Chol/HDL Ratio: 2.1 ratio (ref 0.0–5.0)
Cholesterol, Total: 111 mg/dL (ref 100–199)
HDL: 52 mg/dL (ref >39)
LDL Chol Calc (NIH): 41 mg/dL (ref 0–99)
Triglycerides: 96 mg/dL (ref 0–149)
VLDL Cholesterol Cal: 18 mg/dL (ref 5–40)

## 2020-03-19 MED ORDER — WARFARIN SODIUM 5 MG PO TABS: ORAL_TABLET | ORAL | 6 refills | Status: AC

## 2020-03-19 NOTE — Addendum Note (Signed)
Addended by: Theresia Majors on: 03/19/2020 10:09 AM   Modules accepted: Orders

## 2020-03-19 NOTE — Patient Instructions (Signed)
Medication Instructions:  RESTART taking your Coumadin.   *If you need a refill on your cardiac medications before your next appointment, please call your pharmacy*   Lab Work: TODAY: FLP and CMET If you have labs (blood work) drawn today and your tests are completely normal, you will receive your results only by: Marland Kitchen MyChart Message (if you have MyChart) OR . A paper copy in the mail If you have any lab test that is abnormal or we need to change your treatment, we will call you to review the results.   Follow-Up: At [facility], you and your health needs are our priority.  As part of our continuing mission to provide you with exceptional heart care, we have created designated Provider Care Teams.  These Care Teams include your primary Cardiologist (physician) and Advanced Practice Providers (APPs -  Physician Assistants and Nurse Practitioners) who all work together to provide you with the care you need, when you need it.  We recommend signing up for the patient portal called "MyChart".  Sign up information is provided on this After Visit Summary.  MyChart is used to connect with patients for Virtual Visits (Telemedicine).  Patients are able to view lab/test results, encounter notes, upcoming appointments, etc.  Non-urgent messages can be sent to your provider as well.   To learn more about what you can do with MyChart, go to [email or web address].    Your next appointment:   1 year(s)  The format for your next appointment:   In Person  Provider:   You may see Armanda Magic, MD or one of the following Advanced Practice Providers on your designated Care Team:    Ronie Spies, PA-C  Jacolyn Reedy, PA-C

## 2020-03-19 NOTE — Progress Notes (Signed)
Cardiology Office Note:    Date:  03/19/2020   ID:  Scott Levine, Scott Levine 1941-02-24, MRN [id number]  PCP:  Olive Bass, MD  Cardiologist:  Armanda Magic, MD    Referring MD: Olive Bass, MD   Chief Complaint  Patient presents with  . Coronary Artery Disease  . Atrial Fibrillation  . Hypertension  . Hyperlipidemia  . Cardiomyopathy    History of Present Illness:    Scott Levine is a 79 y.o. male with a hx of persistent atrial fibrillations/p DCCV to NSR 07/05/2015, HTN, Type II DM, dyslipidemia, CVA and DCM with EF 35-40%. He also has a history ofASCAD with single vessel obstructive coronary artery disease in a very large RCA, with 90% stenosis in the mid portion, successfully treated with a drug-eluting stent11/2016 with residual40% LCx and luminal irregularirites elsewhere.  He is here today for followup and is doing well.  He denies any chest pain or pressure, SOB, DOE, PND, orthopnea, LE edema, dizziness, palpitations or syncope. He is compliant with his meds and is tolerating meds with no SE.    Past Medical History:  Diagnosis Date  . Anemia   . Arthritis    "all over"  . CAD (coronary artery disease), native coronary artery    Cath 10/2015 with 90% RCA s/p DES stent with residual 40% LCx and luminal irregularities elsewhere.  . Gout   . HTN (hypertension)   . Hyperlipidemia    LDL goal < 70  . Hypogonadism male   . Ischemic dilated cardiomyopathy (HCC)    EF 50-55% by echo 2017  . Kidney stones 1960's   "passed them"  . Leg muscle spasm   . Migraine    "when I was young"  . PAF (paroxysmal atrial fibrillation) (HCC)    CHADS2VASC score is 6 on Xarelto  . Pneumonia X 1-2  . Stroke (HCC) ~ 05/12/2006; 05/17/2006; 07/18/2006   "small; big; small; can't walk; weaker on my right since" (10/03/2015)  . Type II diabetes mellitus (HCC)     Past Surgical History:  Procedure Laterality Date  . APPENDECTOMY    . CARDIAC CATHETERIZATION N/A 10/03/2015   Procedure: Left Heart Cath and Coronary Angiography;  Surgeon: Corky Crafts, MD;  Location: [facility] INVASIVE CV LAB;  Service: Cardiovascular;  Laterality: N/A;  . CARDIAC CATHETERIZATION N/A 10/03/2015   Procedure: Coronary Stent Intervention;  Surgeon: Corky Crafts, MD;  Location: [facility] INVASIVE CV LAB;  Service: Cardiovascular;  Laterality: N/A;  . CARDIOVERSION N/A 07/05/2015   Procedure: CARDIOVERSION;  Surgeon: Wendall Stade, MD;  Location: [facility] ENDOSCOPY;  Service: Cardiovascular;  Laterality: N/A;  . CARPAL TUNNEL RELEASE Left 1988  . SOFT TISSUE MASS EXCISION  1991   GROWTH ON CHEST TAKEN OFF; IT WOULD SWELL AND GET SORE; DR. SAID IT WASN'T CANCER"  . TONSILLECTOMY      Current Medications: No outpatient medications have been marked as taking for the 03/19/20 encounter (Office Visit) with Quintella Reichert, MD.     Allergies:   Penicillins   Social History   Socioeconomic History  . Marital status: Divorced    Spouse name: Not on file  . Number of children: 3  . Years of education: 9  . Highest education level: Not on file  Occupational History  . Occupation: retired  Tobacco Use  . Smoking status: Former Smoker    Packs/day: 1.00    Years: 4.00    Pack years: 4.00    Types:  Cigarettes, Cigars    Quit date: 06/26/1964    Years since quitting: 55.7  . Smokeless tobacco: Former Systems developer    Quit date: 12/01/1965  Substance and Sexual Activity  . Alcohol use: Yes    Alcohol/week: 0.0 standard drinks    Comment: 10/03/2015 "nothing since  the early 2000's"  . Drug use: No  . Sexual activity: Yes  Other Topics Concern  . Not on file  Social History Narrative  . Not on file   Social Determinants of Health   Financial Resource Strain:   . Difficulty of Paying Living Expenses:   Food Insecurity:   . Worried About Charity fundraiser in the Last Year:   . Arboriculturist in the Last Year:   Transportation Needs:   . Film/video editor (Medical):   Marland Kitchen Lack of  Transportation (Non-Medical):   Physical Activity:   . Days of Exercise per Week:   . Minutes of Exercise per Session:   Stress:   . Feeling of Stress :   Social Connections:   . Frequency of Communication with Friends and Family:   . Frequency of Social Gatherings with Friends and Family:   . Attends Religious Services:   . Active Member of Clubs or Organizations:   . Attends Archivist Meetings:   Marland Kitchen Marital Status:      Family History: The patient's family history includes Breast cancer in his mother and sister; Diabetes in his maternal grandfather, maternal grandmother, mother, and son; Healthy in his brother and brother; Heart attack in his father; Liver cancer in his brother; Other in his daughter and son.  ROS:   Please see the history of present illness.    ROS  All other systems reviewed and negative.   EKGs/Labs/Other Studies Reviewed:    The following studies were reviewed today: none  EKG:  EKG is  ordered today.  The ekg ordered today demonstrates atrial fibrillation with PVCs and no ST changes  Recent Labs: No results found for requested labs within last 8760 hours.   Recent Lipid Panel    Component Value Date/Time   CHOL 137 09/01/2017 0937   TRIG 135 09/01/2017 0937   HDL 53 09/01/2017 0937   CHOLHDL 2.6 09/01/2017 0937   LDLCALC 57 09/01/2017 0937    Physical Exam:    VS:  BP 114/68   Pulse 100   Ht 5\' 7"  (1.702 m)   Wt 193 lb (87.5 kg)   BMI 30.23 kg/m     Wt Readings from Last 3 Encounters:  03/19/20 193 lb (87.5 kg)  03/17/19 191 lb (86.6 kg)  02/05/18 194 lb 6.4 oz (88.2 kg)     GEN:  Well nourished, well developed in no acute distress HEENT: Normal NECK: No JVD; No carotid bruits LYMPHATICS: No lymphadenopathy CARDIAC: irregularly irregular, no murmurs, rubs, gallops RESPIRATORY:  Clear to auscultation without rales, wheezing or rhonchi  ABDOMEN: Soft, non-tender, non-distended MUSCULOSKELETAL:  No edema; No deformity    SKIN: Warm and dry NEUROLOGIC:  Alert and oriented x 3 PSYCHIATRIC:  Normal affect   ASSESSMENT:    1. Persistent atrial fibrillation ([place])   2. Benign essential HTN   3. Ischemic cardiomyopathy   4. Coronary artery disease involving native coronary artery of native heart without angina pectoris   5. Dyslipidemia    PLAN:    In order of problems listed above:  1.  Persistent atrial fibrillation  -he is s/p DCCV to NSR  07/2015  -he is maintaining NSR on exam today -he has not had any bleeding problems on the warfarin -continue Carvedilol 3.125mg  BID   -he stopped the warfarin due to his finger bleeding - I have instructed him to restart it  2.  HTN  -BP is controlled on exam today -continue Carvedilol 3.125mg  BID and Lisinopril 30mg  daily   3.  Ischemic DCM  - his 2D echo 2017 showed low normal LVF with EF 50-55% and G1DD.   -continue carvedilol and ACE I -check BMET today  4.  ASCAD -  Cath 2016 showed single vessel obstructive coronary artery disease in a very large RCA, with 90% stenosis in the mid portion, successfully treated with a drug-eluting stent11/2016 with residual40% LCx and luminal irregularitis elsewhere. -He denies any anginal sx since I saw him last -continue Plavix 75mg  daily, BB and statin -no ASA due to DOAC  5.  Hyperlipidemia  - His LDL goal is < 70.  -continue simvastatin 20mg  daily  -check CMET and FLP  Medication Adjustments/Labs and Tests Ordered: Current medicines are reviewed at length with the patient today.  Concerns regarding medicines are outlined above.  No orders of the defined types were placed in this encounter.  No orders of the defined types were placed in this encounter.   Signed, 11/2015, MD  03/19/2020 9:46 AM    [facility]

## 2020-07-28 DIAGNOSIS — I340 Nonrheumatic mitral (valve) insufficiency: Secondary | ICD-10-CM

## 2021-01-25 DIAGNOSIS — E119 Type 2 diabetes mellitus without complications: Secondary | ICD-10-CM

## 2021-01-25 DIAGNOSIS — I469 Cardiac arrest, cause unspecified: Secondary | ICD-10-CM

## 2021-01-25 DIAGNOSIS — I5022 Chronic systolic (congestive) heart failure: Secondary | ICD-10-CM

## 2021-01-25 DIAGNOSIS — I2510 Atherosclerotic heart disease of native coronary artery without angina pectoris: Secondary | ICD-10-CM | POA: Diagnosis not present

## 2021-01-29 DEATH — deceased
